# Patient Record
Sex: Male | Born: 1940 | Race: White | Hispanic: No | Marital: Married | Smoking: Never smoker
Health system: Southern US, Community
[De-identification: ages and names within clinical notes are randomized; demographics above are authoritative.]

## PROBLEM LIST (undated history)

## (undated) DIAGNOSIS — Z789 Other specified health status: Secondary | ICD-10-CM

## (undated) DIAGNOSIS — L649 Androgenic alopecia, unspecified: Secondary | ICD-10-CM

## (undated) HISTORY — PX: APPENDECTOMY: SHX54

---

## 2016-06-04 ENCOUNTER — Encounter (HOSPITAL_COMMUNITY): Payer: Self-pay | Admitting: Emergency Medicine

## 2016-06-04 ENCOUNTER — Inpatient Hospital Stay (HOSPITAL_COMMUNITY)
Admission: EM | Admit: 2016-06-04 | Discharge: 2016-06-06 | DRG: 069 | Disposition: A | Discharge status: Home / Self Care | Payer: Federal, State, Local not specified - PPO | Attending: Internal Medicine | Admitting: Internal Medicine

## 2016-06-04 ENCOUNTER — Emergency Department (HOSPITAL_COMMUNITY): Payer: Federal, State, Local not specified - PPO

## 2016-06-04 DIAGNOSIS — R29701 NIHSS score 1: Secondary | ICD-10-CM | POA: Diagnosis present

## 2016-06-04 DIAGNOSIS — N179 Acute kidney failure, unspecified: Secondary | ICD-10-CM | POA: Diagnosis not present

## 2016-06-04 DIAGNOSIS — M6282 Rhabdomyolysis: Secondary | ICD-10-CM | POA: Diagnosis present

## 2016-06-04 DIAGNOSIS — G459 Transient cerebral ischemic attack, unspecified: Principal | ICD-10-CM | POA: Insufficient documentation

## 2016-06-04 DIAGNOSIS — Z7982 Long term (current) use of aspirin: Secondary | ICD-10-CM

## 2016-06-04 DIAGNOSIS — I493 Ventricular premature depolarization: Secondary | ICD-10-CM | POA: Diagnosis present

## 2016-06-04 DIAGNOSIS — E871 Hypo-osmolality and hyponatremia: Secondary | ICD-10-CM | POA: Diagnosis not present

## 2016-06-04 DIAGNOSIS — R200 Anesthesia of skin: Secondary | ICD-10-CM

## 2016-06-04 DIAGNOSIS — I472 Ventricular tachycardia: Secondary | ICD-10-CM | POA: Diagnosis present

## 2016-06-04 DIAGNOSIS — I4729 Other ventricular tachycardia: Secondary | ICD-10-CM

## 2016-06-04 DIAGNOSIS — Z79899 Other long term (current) drug therapy: Secondary | ICD-10-CM

## 2016-06-04 DIAGNOSIS — R202 Paresthesia of skin: Secondary | ICD-10-CM | POA: Diagnosis not present

## 2016-06-04 DIAGNOSIS — Z823 Family history of stroke: Secondary | ICD-10-CM

## 2016-06-04 HISTORY — DX: Other specified health status: Z78.9

## 2016-06-04 HISTORY — DX: Androgenic alopecia, unspecified: L64.9

## 2016-06-04 LAB — COMPREHENSIVE METABOLIC PANEL
ALBUMIN: 4.9 g/dL (ref 3.5–5.0)
ALK PHOS: 75 U/L (ref 38–126)
ALT: 27 U/L (ref 17–63)
AST: 44 U/L — AB (ref 15–41)
Anion gap: 12 (ref 5–15)
BILIRUBIN TOTAL: 1.7 mg/dL — AB (ref 0.3–1.2)
BUN: 24 mg/dL — AB (ref 6–20)
CALCIUM: 9.8 mg/dL (ref 8.9–10.3)
CO2: 24 mmol/L (ref 22–32)
CREATININE: 1.53 mg/dL — AB (ref 0.61–1.24)
Chloride: 89 mmol/L — ABNORMAL LOW (ref 101–111)
GFR calc Af Amer: 51 mL/min — ABNORMAL LOW (ref >60)
GFR, EST NON AFRICAN AMERICAN: 44 mL/min — AB (ref >60)
GLUCOSE: 95 mg/dL (ref 65–99)
POTASSIUM: 3.7 mmol/L (ref 3.5–5.1)
Sodium: 125 mmol/L — ABNORMAL LOW (ref 135–145)
TOTAL PROTEIN: 7.7 g/dL (ref 6.5–8.1)

## 2016-06-04 LAB — CBC
HEMATOCRIT: 43.2 % (ref 39.0–52.0)
HEMOGLOBIN: 15.1 g/dL (ref 13.0–17.0)
MCH: 31.4 pg (ref 26.0–34.0)
MCHC: 35 g/dL (ref 30.0–36.0)
MCV: 89.8 fL (ref 78.0–100.0)
Platelets: 200 10*3/uL (ref 150–400)
RBC: 4.81 MIL/uL (ref 4.22–5.81)
RDW: 12.3 % (ref 11.5–15.5)
WBC: 9.8 10*3/uL (ref 4.0–10.5)

## 2016-06-04 LAB — DIFFERENTIAL
BASOS ABS: 0 10*3/uL (ref 0.0–0.1)
Basophils Relative: 0 %
Eosinophils Absolute: 0 10*3/uL (ref 0.0–0.7)
Eosinophils Relative: 0 %
LYMPHS ABS: 1.3 10*3/uL (ref 0.7–4.0)
LYMPHS PCT: 14 %
MONOS PCT: 5 %
Monocytes Absolute: 0.5 10*3/uL (ref 0.1–1.0)
NEUTROS ABS: 7.9 10*3/uL — AB (ref 1.7–7.7)
NEUTROS PCT: 81 %

## 2016-06-04 LAB — I-STAT CHEM 8, ED
BUN: 25 mg/dL — AB (ref 6–20)
CALCIUM ION: 1.1 mmol/L — AB (ref 1.12–1.23)
CHLORIDE: 94 mmol/L — AB (ref 101–111)
CREATININE: 1.6 mg/dL — AB (ref 0.61–1.24)
Glucose, Bld: 91 mg/dL (ref 65–99)
HCT: 46 % (ref 39.0–52.0)
Hemoglobin: 15.6 g/dL (ref 13.0–17.0)
Potassium: 3.7 mmol/L (ref 3.5–5.1)
SODIUM: 127 mmol/L — AB (ref 135–145)
TCO2: 26 mmol/L (ref 0–100)

## 2016-06-04 LAB — CK: Total CK: 1108 U/L — ABNORMAL HIGH (ref 49–397)

## 2016-06-04 LAB — I-STAT TROPONIN, ED: Troponin i, poc: 0.01 ng/mL (ref 0.00–0.08)

## 2016-06-04 LAB — APTT: APTT: 29 s (ref 24–37)

## 2016-06-04 LAB — PROTIME-INR
INR: 1.1 (ref 0.00–1.49)
Prothrombin Time: 14.4 seconds (ref 11.6–15.2)

## 2016-06-04 LAB — CBG MONITORING, ED: Glucose-Capillary: 91 mg/dL (ref 65–99)

## 2016-06-04 MED ORDER — SENNOSIDES-DOCUSATE SODIUM 8.6-50 MG PO TABS: 1.0000 | ORAL_TABLET | Freq: Every evening | ORAL | Status: DC | PRN

## 2016-06-04 MED ORDER — SODIUM CHLORIDE 0.9 % IV SOLN
1000.0000 mL | Freq: Once | INTRAVENOUS | Status: AC
  Administered 2016-06-04: 1000 mL via INTRAVENOUS

## 2016-06-04 MED ORDER — STROKE: EARLY STAGES OF RECOVERY BOOK
Freq: Once | Status: AC
  Administered 2016-06-05
  Filled 2016-06-04: qty 1

## 2016-06-04 MED ORDER — SODIUM CHLORIDE 0.9 % IV SOLN
INTRAVENOUS | Status: DC
  Administered 2016-06-04 – 2016-06-05 (×3): via INTRAVENOUS

## 2016-06-04 MED ORDER — ASPIRIN 325 MG PO TABS
325.0000 mg | ORAL_TABLET | Freq: Every day | ORAL | Status: DC
  Administered 2016-06-05: 325 mg via ORAL
  Filled 2016-06-04: qty 1

## 2016-06-04 MED ORDER — ASPIRIN 300 MG RE SUPP: 300.0000 mg | Freq: Every day | RECTAL | Status: DC

## 2016-06-04 MED ORDER — SODIUM CHLORIDE 0.9 % IV SOLN: Freq: Once | INTRAVENOUS | Status: DC

## 2016-06-04 MED ORDER — ENOXAPARIN SODIUM 30 MG/0.3ML ~~LOC~~ SOLN
30.0000 mg | SUBCUTANEOUS | Status: DC
  Administered 2016-06-05: 30 mg via SUBCUTANEOUS
  Filled 2016-06-04: qty 0.3

## 2016-06-04 NOTE — Consult Note (Signed)
Admission H&P    Chief Complaint: New onset left upper extremity numbness.  HPI: Ethan Padilla is an 75 y.o. male with no known medical illness and on no medications result in with acute onset of left hand and distal arm weakness at about 6:45 PM today. 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 There was no change in speech and no weakness nor numbness of left lower extremity or left face. CT scan of his head showed no acute intracranial abnormality. NIH stroke score was 1 for mild drift of left upper extremity. He was not considered a candidate for TPA because of minimal deficits present.  LSN: 6:45 PM on 06/04/2016 tPA Given: No: Minimal deficits mRankin:  Past medical history: No known medical illness  Past Surgical History  Procedure Laterality Date  . Appendectomy      Family history: Positive for stroke involving his father  Social History:  reports that he has never smoked. He does not have any smokeless tobacco history on file. He reports that he drinks alcohol. He reports that he does not use illicit drugs.  Allergies: No Known Allergies  Medications: None prior to admission  ROS: History obtained from the patient  General ROS: negative for - chills, fatigue, fever, night sweats, weight gain or weight loss Psychological ROS: negative for - behavioral disorder, hallucinations, memory difficulties, mood swings or suicidal ideation Ophthalmic ROS: negative for - blurry vision, double vision, eye pain or loss of vision ENT ROS: negative for - epistaxis, nasal discharge, oral lesions, sore throat, tinnitus or vertigo Allergy and Immunology ROS: negative for - hives or itchy/watery eyes Hematological and Lymphatic ROS: negative for - bleeding problems, bruising or swollen lymph nodes Endocrine ROS: negative for - galactorrhea, hair pattern changes, polydipsia/polyuria or temperature intolerance Respiratory ROS: negative for - cough,  hemoptysis, shortness of breath or wheezing Cardiovascular ROS: negative for - chest pain, dyspnea on exertion, edema or irregular heartbeat Gastrointestinal ROS: negative for - abdominal pain, diarrhea, hematemesis, nausea/vomiting or stool incontinence Genito-Urinary ROS: negative for - dysuria, hematuria, incontinence or urinary frequency/urgency Musculoskeletal ROS: negative for - joint swelling or muscular weakness Neurological ROS: as noted in HPI Dermatological ROS: negative for rash and skin lesion changes  Physical Examination: Blood pressure 117/84, pulse 88, temperature 97.3 F (36.3 C), temperature source Oral, resp. rate 15, height 5\' 7"  (1.702 m), weight 67.586 kg (149 lb), SpO2 98 %.  HEENT-  Normocephalic, no lesions, without obvious abnormality.  Normal external eye and conjunctiva.  Normal TM's bilaterally.  Normal auditory canals and external ears. Normal external nose, mucus membranes and septum.  Normal pharynx. Neck supple with no masses, nodes, nodules or enlargement. Cardiovascular - regular rate and rhythm, S1, S2 normal, no murmur, click, rub or gallop Lungs - chest clear, no wheezing, rales, normal symmetric air entry Abdomen - soft, non-tender; bowel sounds normal; no masses,  no organomegaly Extremities - no joint deformities, effusion, or inflammation and no edema  Neurologic Examination: Mental Status: Alert, oriented, thought content appropriate.  Speech fluent without evidence of aphasia. Able to follow commands without difficulty. Cranial Nerves: II-Visual fields were normal. III/IV/VI-Pupils were equal and reacted normally to light. Extraocular movements were full and conjugate.    V/VII-no facial numbness and no facial weakness. VIII-normal. X-normal speech except for mild abnormality in fluency (chronic and unchanged). XI: trapezius strength/neck flexion strength normal bilaterally XII-midline tongue extension with normal strength. Motor: Mild drift  of left  upper extremity as well as reduced grip strength of left hand compared to the right; motor exam is otherwise unremarkable. Sensory: Normal throughout. Deep Tendon Reflexes: Trace to 1+ and symmetric in upper extremities and absent in lower extremities. Plantars: Flexor bilaterally Cerebellar: Normal coordination of upper extremities. Carotid auscultation: Normal  Results for orders placed or performed during the hospital encounter of 06/04/16 (from the past 48 hour(s))  CBC     Status: None   Collection Time: 06/04/16  7:41 PM  Result Value Ref Range   WBC 9.8 4.0 - 10.5 K/uL   RBC 4.81 4.22 - 5.81 MIL/uL   Hemoglobin 15.1 13.0 - 17.0 g/dL   HCT 09.8 11.9 - 14.7 %   MCV 89.8 78.0 - 100.0 fL   MCH 31.4 26.0 - 34.0 pg   MCHC 35.0 30.0 - 36.0 g/dL   RDW 82.9 56.2 - 13.0 %   Platelets 200 150 - 400 K/uL  Differential     Status: Abnormal   Collection Time: 06/04/16  7:41 PM  Result Value Ref Range   Neutrophils Relative % 81 %   Neutro Abs 7.9 (H) 1.7 - 7.7 K/uL   Lymphocytes Relative 14 %   Lymphs Abs 1.3 0.7 - 4.0 K/uL   Monocytes Relative 5 %   Monocytes Absolute 0.5 0.1 - 1.0 K/uL   Eosinophils Relative 0 %   Eosinophils Absolute 0.0 0.0 - 0.7 K/uL   Basophils Relative 0 %   Basophils Absolute 0.0 0.0 - 0.1 K/uL  I-stat troponin, ED     Status: None   Collection Time: 06/04/16  7:50 PM  Result Value Ref Range   Troponin i, poc 0.01 0.00 - 0.08 ng/mL   Comment 3            Comment: Due to the release kinetics of cTnI, a negative result within the first hours of the onset of symptoms does not rule out myocardial infarction with certainty. If myocardial infarction is still suspected, repeat the test at appropriate intervals.   I-Stat Chem 8, ED     Status: Abnormal   Collection Time: 06/04/16  7:53 PM  Result Value Ref Range   Sodium 127 (L) 135 - 145 mmol/L   Potassium 3.7 3.5 - 5.1 mmol/L   Chloride 94 (L) 101 - 111 mmol/L   BUN 25 (H) 6 - 20 mg/dL    Creatinine, Ser 8.65 (H) 0.61 - 1.24 mg/dL   Glucose, Bld 91 65 - 99 mg/dL   Calcium, Ion 7.84 (L) 1.12 - 1.23 mmol/L   TCO2 26 0 - 100 mmol/L   Hemoglobin 15.6 13.0 - 17.0 g/dL   HCT 69.6 29.5 - 28.4 %  CBG monitoring, ED     Status: None   Collection Time: 06/04/16  8:08 PM  Result Value Ref Range   Glucose-Capillary 91 65 - 99 mg/dL   Ct Head Code Stroke W/o Cm  06/04/2016  CLINICAL DATA:  Code stroke. Acute onset of left hand numbness earlier today. EXAM: CT HEAD WITHOUT CONTRAST TECHNIQUE: Contiguous axial images were obtained from the base of the skull through the vertex without intravenous contrast. COMPARISON:  None. FINDINGS: Ventricular system normal in size and appearance for age. Mild cortical and deep atrophy. Mild changes of small vessel disease of the white matter diffusely. No mass lesion. No midline shift. No acute hemorrhage or hematoma. No extra-axial fluid collections. No evidence of acute cortical stroke. No skull fractures identified. No intrinsic osseous abnormality. Visualized paranasal  sinuses well aerated. Nasal bones intact. Normal-appearing sella turcica. CT visualized paranasal sinuses well aerated. Opacification of right mastoid air cells. Left mastoid air cells and bilateral middle ear cavities well-aerated. IMPRESSION: 1. No acute intracranial abnormality. 2. 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 3. Right mastoid effusion. These results were called by telephone at the time of interpretation on 06/04/2016 at 7:52 pm to Dr. Roseanne RenoStewart of Neurology, who verbally acknowledged these results. Electronically Signed   By: Hulan Saashomas  Lawrence M.D.   On: 06/04/2016 19:53    Assessment: 75 y.o. male presenting with probable right subcortical TIA or possible small vessel infarction.  Stroke Risk Factors - family history  Plan: 1. HgbA1c, fasting lipid panel 2. MRI, MRA  of the brain without contrast 3. PT consult, OT  consult 4. Echocardiogram 5. Carotid dopplers 6. Prophylactic therapy-Antiplatelet med: Aspirin  7. Risk factor modification 8. Telemetry monitoring  C.R. Roseanne RenoStewart, MD [facility] [phone]  06/04/2016, 8:26 PM

## 2016-06-04 NOTE — ED Notes (Signed)
Pt here with left hand numbness starting at approx 1845. Pt also reports nausea. MD Delo at bedside. Code Stroke activated.

## 2016-06-04 NOTE — ED Notes (Signed)
Pt at CT

## 2016-06-04 NOTE — ED Notes (Signed)
Pt complains of leg cramps.

## 2016-06-04 NOTE — ED Notes (Signed)
Stroke team at bedside

## 2016-06-04 NOTE — ED Notes (Signed)
MD at bedside. 

## 2016-06-04 NOTE — H&P (Signed)
History and Physical    Ethan Padilla JYN:[id number] DOB: 1941-05-25 DOA: 06/04/2016  PCP:  Jasper Loser - [place] Consultants:  None Patient coming from:  Daughter's home (visiting)  Chief Complaint: L arm numbness  HPI: Ethan Padilla is a 75 y.o. male with no significant past medical history presenting with L arm/hand numbness/weakness.  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  Went inside regularly and drank lots of cold ice water - but nothing else.  Came inside about 1800 to eat with daughter.  Started feeling very nauseated, had been sweating all day and so decided to take a cold shower.  Some cramping in legs which is unusual for him.  Went to lie down and elevated legs.  Some numbness in left hand, on and off.  Chewed a couple of aspirin.  +weakness left arm and hand.  Also some cramping in left hand.  No trouble swallowing.  No dysarthria or slurring, no word finding difficulties.  Has lifelong dysfluency. No headache or visual disturbance.  Momentary chest pain which was very fleeting.  Does have chest pain intermittently which improves with ingesting fluids.  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   ED Course: Code Stroke called; head CT negative, labs unremarkable  Review of Systems: As per HPI; otherwise 10 point review of systems reviewed and negative.   Ambulatory Status: ambulatory  Past Medical History  Diagnosis Date  . Male pattern baldness     Past Surgical History  Procedure Laterality Date  . Appendectomy      Social History   Social History  . Marital Status: Married    Spouse Name: N/A  . Number of Children: N/A  . Years of Education: N/A   Occupational History  . Transport planner for [employer]    Social History Main Topics  . Smoking status: Never Smoker   . Smokeless tobacco: Not on file  . Alcohol Use: Yes     Comment: occasional  . Drug Use: No   . Sexual Activity: Not on file   Other Topics Concern  . Not on file   Social History Narrative   Lives in [place], [place]    No Known Allergies  History reviewed. No pertinent family history.  Prior to Admission medications   Medication Sig Start Date End Date Taking? Authorizing Provider  aspirin 325 MG tablet Take 650 mg by mouth once.   Yes Historical Provider, MD  finasteride (PROPECIA) 1 MG tablet Take 1 mg by mouth daily. 03/20/16  Yes Historical Provider, MD    Physical Exam: Filed Vitals:   06/04/16 2130 06/04/16 2145 06/04/16 2200 06/04/16 2215  BP: 108/85 103/87 113/84 103/73  Pulse: 85 89 80 90  Temp:      TempSrc:      Resp: 21 17 18 18   Height:      Weight:      SpO2: 97% 97% 96% 96%     General:  Appears calm and comfortable and is NAD; occasional speech stuttering which is apparently baseline for him Eyes:  PERRL, EOMI, normal lids, iris ENT:  grossly normal hearing, lips & tongue, mmm Neck:  no LAD, masses or thyromegaly Cardiovascular:  RRR, no m/r/g. No LE edema.  Respiratory:  CTA bilaterally, no w/r/r. Normal respiratory effort. Abdomen:  soft, ntnd, NABS Skin:  no rash or induration seen on limited exam Musculoskeletal:  grossly normal  tone BUE/BLE, good ROM, no bony abnormality; possibly slightly decrease in strength of R upper arm but no other deficits appreciated; +cramp in L calf while trying to test reflexes Psychiatric:  grossly normal mood and affect, speech fluent and appropriate, AOx3 Neurologic:  CN 2-12 grossly intact, moves all extremities in coordinated fashion, sensation intact  Labs on Admission: I have personally reviewed following labs and imaging studies  CBC:  Recent Labs Lab 06/04/16 1941 06/04/16 1953  WBC 9.8  --   NEUTROABS 7.9*  --   HGB 15.1 15.6  HCT 43.2 46.0  MCV 89.8  --   PLT 200  --    Basic Metabolic Panel:  Recent Labs Lab 06/04/16 1941 06/04/16 1953  NA 125* 127*  K 3.7 3.7  CL 89* 94*  CO2 24   --   GLUCOSE 95 91  BUN 24* 25*  CREATININE 1.53* 1.60*  CALCIUM 9.8  --    GFR: Estimated Creatinine Clearance: 39 mL/min (by C-G formula based on Cr of 1.6). Liver Function Tests:  Recent Labs Lab 06/04/16 1941  AST 44*  ALT 27  ALKPHOS 75  BILITOT 1.7*  PROT 7.7  ALBUMIN 4.9   No results for input(s): LIPASE, AMYLASE in the last 168 hours. No results for input(s): AMMONIA in the last 168 hours. Coagulation Profile:  Recent Labs Lab 06/04/16 1941  INR 1.10   Cardiac Enzymes:  Recent Labs Lab 06/04/16 1941  CKTOTAL 1108*   BNP (last 3 results) No results for input(s): PROBNP in the last 8760 hours. HbA1C: No results for input(s): HGBA1C in the last 72 hours. CBG:  Recent Labs Lab 06/04/16 2008  GLUCAP 91   Lipid Profile: No results for input(s): CHOL, HDL, LDLCALC, TRIG, CHOLHDL, LDLDIRECT in the last 72 hours. Thyroid Function Tests: No results for input(s): TSH, T4TOTAL, FREET4, T3FREE, THYROIDAB in the last 72 hours. Anemia Panel: No results for input(s): VITAMINB12, FOLATE, FERRITIN, TIBC, IRON, RETICCTPCT in the last 72 hours. Urine analysis: No results found for: COLORURINE, APPEARANCEUR, LABSPEC, PHURINE, GLUCOSEU, HGBUR, BILIRUBINUR, KETONESUR, PROTEINUR, UROBILINOGEN, NITRITE, LEUKOCYTESUR  Creatinine Clearance: Estimated Creatinine Clearance: 39 mL/min (by C-G formula based on Cr of 1.6).  Sepsis Labs: (procalcitonin:4,lacticidven:4) )No results found for this or any previous visit (from the past 240 hour(s)).   Radiological Exams on Admission: Ct Head Code Stroke W/o Cm  06/04/2016  CLINICAL DATA:  Code stroke. Acute onset of left hand numbness earlier today. EXAM: CT HEAD WITHOUT CONTRAST TECHNIQUE: Contiguous axial images were obtained from the base of the skull through the vertex without intravenous contrast. COMPARISON:  None. FINDINGS: Ventricular system normal in size and appearance for age. Mild cortical and deep atrophy.  Mild changes of small vessel disease of the white matter diffusely. No mass lesion. No midline shift. No acute hemorrhage or hematoma. No extra-axial fluid collections. No evidence of acute cortical stroke. No skull fractures identified. No intrinsic osseous abnormality. Visualized paranasal sinuses well aerated. Nasal bones intact. Normal-appearing sella turcica. CT visualized paranasal sinuses well aerated. Opacification of right mastoid air cells. Left mastoid air cells and bilateral middle ear cavities well-aerated. IMPRESSION: 1. No acute intracranial abnormality. 2. 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 3. Right mastoid effusion. These results were called by telephone at the time of interpretation on 06/04/2016 at 7:52 pm to Dr. Roseanne Reno of Neurology, who verbally acknowledged these results. Electronically Signed   By: Hulan Saas M.D.   On: 06/04/2016 19:53  EKG: Independently reviewed.  NSR with rate 85; PVCs present with no evidence of acute ischemia  Assessment/Plan Active Problems:   TIA (transient ischemic attack)   Hyponatremia   AKI (acute kidney injury) (HCC)   PVC (premature ventricular contraction)   TIA -Patient seen by neurology with concern for TIA -Will admit to obs status with telemetry monitoring and order MRI/MRA -Due to low suspicion for CVA/TIA, was uncertain whether to complete evaluation unless MRI abnormal.  However, he is also having a number of PVCs and has never had an Echo.  Will order Echo and also carotid dopplers to complete evaluation. -Risk stratification with FLP, A1c. -Continue ASA 325 mg daily   AKI/Hyponatremia -Suspect that the underlying cause of all of patient's symptoms is actually exertional rhabdomyolysis - he worked outside all day in the blistering heat and humidity while only rehydrating with water.  Suspect dilutional hyponatremia.   -CK is elevated to 1108, which is fairly mild for  exertional rhabdo. -Will give 2L bolus in ER and continue IVF at 150 cc/hour while inpatient. -Repeat labs in AM and anticipate improvement.   DVT prophylaxis: Lovenox  Code Status: Full - confirmed with patient/family Family Communication: Daughter present at bedside throughout and in agreement with the plan.  She is NOK: Ethan ChestnutSarah Padilla, [phone]. Disposition Plan: Home once clinically improved Consults called: Neurology (by ER) Admission status: Observation   Jonah BlueJennifer Alese Furniss MD [facility]  If 7PM-7AM, please contact night-coverage [email or web address] Password [email or web address]  06/04/2016, 10:44 PM

## 2016-06-04 NOTE — ED Provider Notes (Signed)
CSN: [id number]     Arrival date & time 06/04/16  1921 History   First MD Initiated Contact with Patient 06/04/16 1939     Chief Complaint  Patient presents with  . Numbness  . Nausea     (Consider location/radiation/quality/duration/timing/severity/associated sxs/prior Treatment) HPI Comments: Patient is a 75 year old male with no significant past medical history. He presents for evaluation of numbness in his left hand. This started approximately 45 minutes prior to arrival. He reports that he was working outside in the heat all day in the garden and his symptoms began shortly after he came inside for dinner. He denies any weakness. He denies any involvement of his face or leg. He reports nausea but denies headache. There are no aggravating or alleviating factors. He does report taking 2 baby aspirin prior to coming in.  The history is provided by the patient.    No past medical history on file. No past surgical history on file. No family history on file. Social History  Substance Use Topics  . Smoking status: Not on file  . Smokeless tobacco: Not on file  . Alcohol Use: Not on file    Review of Systems  All other systems reviewed and are negative.     Allergies  Review of patient's allergies indicates not on file.  Home Medications   Prior to Admission medications   Not on File   BP 135/92 mmHg  Pulse 87  Temp(Src) 97.3 F (36.3 C) (Oral)  Resp 17  Ht  (1.702 m)  Wt 149 lb (67.586 kg)  BMI 23.33 kg/m2  SpO2 98% Physical Exam  Constitutional: He is oriented to person, place, and time. He appears well-developed and well-nourished. No distress.  HENT:  Head: Normocephalic and atraumatic.  Mouth/Throat: Oropharynx is clear and moist.  Eyes: EOM are normal. Pupils are equal, round, and reactive to light.  Neck: Normal range of motion. Neck supple.  Cardiovascular: Normal rate and regular rhythm.  Exam reveals no friction rub.   No murmur  heard. Pulmonary/Chest: Effort normal and breath sounds normal. No respiratory distress. He has no wheezes. He has no rales.  Abdominal: Soft. Bowel sounds are normal. He exhibits no distension. There is no tenderness.  Musculoskeletal: Normal range of motion. He exhibits no edema.  Neurological: He is alert and oriented to person, place, and time. No cranial nerve deficit. He exhibits normal muscle tone. Coordination normal.  Skin: Skin is warm and dry. He is not diaphoretic.  Nursing note and vitals reviewed.   ED Course  Procedures (including critical care time) Labs Review Labs Reviewed  PROTIME-INR  APTT  CBC  DIFFERENTIAL  COMPREHENSIVE METABOLIC PANEL  I-STAT TROPOININ, ED  I-STAT CHEM 8, ED  CBG MONITORING, ED    Imaging Review No results found. I have personally reviewed and evaluated these images and lab results as part of my medical decision-making.   EKG Interpretation   Date/Time:  Thursday June 04 2016 19:51:58 EDT Ventricular Rate:  85 PR Interval:    QRS Duration: 100 QT Interval:  388 QTC Calculation: 462 R Axis:   -59 Text Interpretation:  Sinus rhythm Borderline short PR interval  Inferolateral infarct, old Confirmed by Daisja Kessinger  MD, Mildreth Reek ([id number]) on  06/04/2016 10:10:56 PM      MDM   Final diagnoses:  None    Patient presents with complaints of left arm numbness that started after working in the yard. He is neurologically intact and his complaints are mainly subjective. However,  since he was in the timeframe of possible intervention, a code stroke was called. His initial head CT was negative and laboratory studies are unremarkable. He was evaluated by Dr. Roseanne RenoStewart who is recommending admission. He will be admitted to the hospitalist service under the care of Dr. Ophelia CharterYates.    Geoffery Lyonsouglas Lyndsey Demos, MD 06/04/16 2212

## 2016-06-04 NOTE — Progress Notes (Signed)
Pt admitted from ED with stroke symptoms, pt alert and oriented, settled in bed with call light at bedside, pt and family reassured,will continue to monitor. Obasogie-Asidi, Akshitha Culmer Efe

## 2016-06-05 ENCOUNTER — Encounter (HOSPITAL_COMMUNITY): Payer: Self-pay | Admitting: Student

## 2016-06-05 ENCOUNTER — Observation Stay (HOSPITAL_COMMUNITY): Payer: Federal, State, Local not specified - PPO

## 2016-06-05 ENCOUNTER — Observation Stay (HOSPITAL_BASED_OUTPATIENT_CLINIC_OR_DEPARTMENT_OTHER): Payer: Federal, State, Local not specified - PPO

## 2016-06-05 DIAGNOSIS — R29701 NIHSS score 1: Secondary | ICD-10-CM | POA: Diagnosis present

## 2016-06-05 DIAGNOSIS — M6282 Rhabdomyolysis: Secondary | ICD-10-CM | POA: Diagnosis present

## 2016-06-05 DIAGNOSIS — E871 Hypo-osmolality and hyponatremia: Secondary | ICD-10-CM | POA: Diagnosis present

## 2016-06-05 DIAGNOSIS — I493 Ventricular premature depolarization: Secondary | ICD-10-CM

## 2016-06-05 DIAGNOSIS — G458 Other transient cerebral ischemic attacks and related syndromes: Secondary | ICD-10-CM | POA: Diagnosis not present

## 2016-06-05 DIAGNOSIS — E860 Dehydration: Secondary | ICD-10-CM | POA: Diagnosis not present

## 2016-06-05 DIAGNOSIS — Z823 Family history of stroke: Secondary | ICD-10-CM | POA: Diagnosis not present

## 2016-06-05 DIAGNOSIS — R202 Paresthesia of skin: Secondary | ICD-10-CM | POA: Diagnosis present

## 2016-06-05 DIAGNOSIS — G459 Transient cerebral ischemic attack, unspecified: Secondary | ICD-10-CM

## 2016-06-05 DIAGNOSIS — N179 Acute kidney failure, unspecified: Secondary | ICD-10-CM | POA: Diagnosis present

## 2016-06-05 DIAGNOSIS — I472 Ventricular tachycardia: Secondary | ICD-10-CM

## 2016-06-05 DIAGNOSIS — Z7982 Long term (current) use of aspirin: Secondary | ICD-10-CM | POA: Diagnosis not present

## 2016-06-05 DIAGNOSIS — I4729 Other ventricular tachycardia: Secondary | ICD-10-CM

## 2016-06-05 DIAGNOSIS — Z79899 Other long term (current) drug therapy: Secondary | ICD-10-CM | POA: Diagnosis not present

## 2016-06-05 DIAGNOSIS — R208 Other disturbances of skin sensation: Secondary | ICD-10-CM | POA: Diagnosis not present

## 2016-06-05 LAB — VAS US CAROTID
LCCADSYS: -84 cm/s
LEFT ECA DIAS: -29 cm/s
LEFT VERTEBRAL DIAS: 17 cm/s
Left CCA dist dias: -22 cm/s
Left CCA prox dias: -19 cm/s
Left CCA prox sys: -96 cm/s
Left ICA dist dias: -33 cm/s
Left ICA dist sys: -90 cm/s
Left ICA prox dias: -30 cm/s
Left ICA prox sys: -84 cm/s
RCCAPDIAS: -21 cm/s
RIGHT ECA DIAS: -15 cm/s
RIGHT VERTEBRAL DIAS: 17 cm/s
Right CCA prox sys: -83 cm/s
Right cca dist sys: -104 cm/s

## 2016-06-05 LAB — BASIC METABOLIC PANEL
ANION GAP: 10 (ref 5–15)
Anion gap: 3 — ABNORMAL LOW (ref 5–15)
BUN: 21 mg/dL — ABNORMAL HIGH (ref 6–20)
BUN: 9 mg/dL (ref 6–20)
CHLORIDE: 109 mmol/L (ref 101–111)
CO2: 25 mmol/L (ref 22–32)
CO2: 24 mmol/L (ref 22–32)
CREATININE: 0.77 mg/dL (ref 0.61–1.24)
Calcium: 9.2 mg/dL (ref 8.9–10.3)
Calcium: 8.3 mg/dL — ABNORMAL LOW (ref 8.9–10.3)
Chloride: 96 mmol/L — ABNORMAL LOW (ref 101–111)
Creatinine, Ser: 1.25 mg/dL — ABNORMAL HIGH (ref 0.61–1.24)
GFR calc Af Amer: >60 mL/min (ref >60)
GFR calc non Af Amer: 56 mL/min — ABNORMAL LOW (ref >60)
GFR calc non Af Amer: >60 mL/min (ref >60)
GLUCOSE: 79 mg/dL (ref 65–99)
Glucose, Bld: 98 mg/dL (ref 65–99)
POTASSIUM: 3.6 mmol/L (ref 3.5–5.1)
POTASSIUM: 4.4 mmol/L (ref 3.5–5.1)
Sodium: 130 mmol/L — ABNORMAL LOW (ref 135–145)
Sodium: 137 mmol/L (ref 135–145)

## 2016-06-05 LAB — HEPATIC FUNCTION PANEL
ALBUMIN: 4.2 g/dL (ref 3.5–5.0)
ALK PHOS: 75 U/L (ref 38–126)
ALT: 23 U/L (ref 17–63)
AST: 42 U/L — AB (ref 15–41)
BILIRUBIN TOTAL: 1.1 mg/dL (ref 0.3–1.2)
Bilirubin, Direct: 0.2 mg/dL (ref 0.1–0.5)
Indirect Bilirubin: 0.9 mg/dL (ref 0.3–0.9)
TOTAL PROTEIN: 6.6 g/dL (ref 6.5–8.1)

## 2016-06-05 LAB — CK
CK TOTAL: 766 U/L — AB (ref 49–397)
Total CK: 1136 U/L — ABNORMAL HIGH (ref 49–397)

## 2016-06-05 LAB — ECHOCARDIOGRAM COMPLETE
Height: 67 in
Weight: 2384 oz

## 2016-06-05 LAB — CBC
HEMATOCRIT: 39.9 % (ref 39.0–52.0)
HEMOGLOBIN: 13.8 g/dL (ref 13.0–17.0)
MCH: 31.2 pg (ref 26.0–34.0)
MCHC: 34.6 g/dL (ref 30.0–36.0)
MCV: 90.1 fL (ref 78.0–100.0)
Platelets: 166 10*3/uL (ref 150–400)
RBC: 4.43 MIL/uL (ref 4.22–5.81)
RDW: 12.3 % (ref 11.5–15.5)
WBC: 7.2 10*3/uL (ref 4.0–10.5)

## 2016-06-05 LAB — MAGNESIUM: Magnesium: 2 mg/dL (ref 1.7–2.4)

## 2016-06-05 LAB — LIPID PANEL
Cholesterol: 163 mg/dL (ref 0–200)
HDL: 67 mg/dL (ref >40)
LDL Cholesterol: 82 mg/dL (ref 0–99)
TRIGLYCERIDES: 70 mg/dL (ref <150)
Total CHOL/HDL Ratio: 2.4 RATIO
VLDL: 14 mg/dL (ref 0–40)

## 2016-06-05 MED ORDER — SODIUM CHLORIDE 0.9 % IV SOLN
INTRAVENOUS | Status: DC
  Administered 2016-06-05: 18:00:00 via INTRAVENOUS

## 2016-06-05 MED ORDER — ENOXAPARIN SODIUM 40 MG/0.4ML ~~LOC~~ SOLN
40.0000 mg | SUBCUTANEOUS | Status: DC
  Administered 2016-06-06: 40 mg via SUBCUTANEOUS
  Filled 2016-06-05: qty 0.4

## 2016-06-05 MED ORDER — ASPIRIN EC 81 MG PO TBEC
81.0000 mg | DELAYED_RELEASE_TABLET | Freq: Every day | ORAL | Status: DC
  Administered 2016-06-06: 81 mg via ORAL
  Filled 2016-06-05: qty 1

## 2016-06-05 NOTE — Evaluation (Signed)
Occupational Therapy Evaluation Patient Details Name: Ethan Padilla MRN: [id number] DOB: 07/05/1941 Today's Date: 06/05/2016    History of Present Illness 75 yo male admitted with L arm / hand numbness MRI (-) CT (-) continued workup for TIA PMH: appendectomy   Clinical Impression   Patient evaluated by Occupational Therapy with no further acute OT needs identified. All education has been completed and the patient has no further questions. See below for any follow-up Occupational Therapy or equipment needs. OT to sign off. Thank you for referral.      Follow Up Recommendations  No OT follow up    Equipment Recommendations  None recommended by OT    Recommendations for Other Services       Precautions / Restrictions Precautions Precautions: None      Mobility Bed Mobility Overal bed mobility: Independent                Transfers Overall transfer level: Independent                    Balance                                            ADL Overall ADL's : Independent                                       General ADL Comments: completed grooming and toilet transfer. Pt is at baseline. pt expressed "i am ready to go home" pt educated that PT Rocky LinkKen will assess stairs prior to d/c      Vision     Perception     Praxis      Pertinent Vitals/Pain Pain Assessment: No/denies pain     Hand Dominance Right   Extremity/Trunk Assessment Upper Extremity Assessment Upper Extremity Assessment: Overall WFL for tasks assessed   Lower Extremity Assessment Lower Extremity Assessment: Overall WFL for tasks assessed   Cervical / Trunk Assessment Cervical / Trunk Assessment: Normal   Communication Communication Communication: Other (comment) (baseline speech deficits)   Cognition Arousal/Alertness: Awake/alert Behavior During Therapy: WFL for tasks assessed/performed Overall Cognitive Status: Within Functional Limits for  tasks assessed                     General Comments       Exercises       Shoulder Instructions      Home Living Family/patient expects to be discharged to:: Private residence Living Arrangements: Spouse/significant other;Children Available Help at Discharge: Family Type of Home: Apartment Home Access: Elevator;Stairs to enter Secretary/administratorntrance Stairs-Number of Steps: 2   Home Layout: Two level     Bathroom Shower/Tub: Chief Strategy OfficerTub/shower unit   Bathroom Toilet: Standard     Home Equipment: None   Additional Comments: Pt has 3 homes that he stays at and when asked which he will be at most he states "all of them equal" Pt will d/c to daughters home with stairs to enter and stairs for 2 floor. monday will fly to d/c and require 2 stairs to enter high rise condo with elevator and then will leave this location to go to family home that is old with multiple stairs to enter and to reach bedrooms      Prior Functioning/Environment Level of Independence: Independent  OT Diagnosis:     OT Problem List:     OT Treatment/Interventions:      OT Goals(Current goals can be found in the care plan section)    OT Frequency:     Barriers to D/C:            Co-evaluation              End of Session    Activity Tolerance: Patient tolerated treatment well Patient left: in chair;with call bell/phone within reach;with chair alarm set   Time: 8119-1478 OT Time Calculation (min): 18 min Charges:  OT General Charges $OT Visit: 1 Procedure OT Evaluation $OT Eval Low Complexity: 1 Procedure G-Codes: OT G-codes **NOT FOR INPATIENT CLASS** Functional Assessment Tool Used: clinical judgement Functional Limitation: Self care Self Care Current Status (G9562): 0 percent impaired, limited or restricted Self Care Goal Status (Z3086): 0 percent impaired, limited or restricted Self Care Discharge Status (V7846): 0 percent impaired, limited or restricted  Boone Master  B 06/05/2016, 10:23 AM   Mateo Flow   OTR/L Pager: [phone] Office: [phone] .

## 2016-06-05 NOTE — Progress Notes (Signed)
PROGRESS NOTE  Ethan Padilla  ZOX:[id number] DOB: 1941/01/23  DOA: 06/04/2016 PCP: No primary care provider on file.   Brief Narrative:  75 year old male, works as a Transport planner for [employer], travels a lot between [place] and [place], physically quite active, no significant PMH, was visiting his daughter who lives and works in [place], presented to [facility] ED on 06/04/16 with left arm/hand numbness and weakness. He had worked in his daughter's yard all day in the heat and apparently did not hydrate himself adequately. He complained of some cramps in his legs. He took some aspirin and decided to come to the ED. History of chest pain intermittently which improves with ingesting fluids. 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 In the ED, code stroke was called. Head CT was negative and labs were unremarkable. Patient was admitted for further evaluation and management.   Assessment & Plan:   Active Problems:   TIA (transient ischemic attack)   Hyponatremia   AKI (acute kidney injury) (HCC)   PVC (premature ventricular contraction)   Possible TIA versus dehydration - Resultant intermittent tingling of left arm. Deficits have resolved. - CT head 06/04/16: No acute intracranial abnormality. - MRI brain 06/05/16: No acute stroke - MRA brain 06/05/16: Unremarkable. - Carotid Dopplers: No significant ICA stenosis. - 2-D echo: LVEF 50-55 percent and grade 1 diastolic dysfunction, moderate MR. - LDL: 82 - Hemoglobin A1c: Pending. - No antithrombotic prior to admission, now on aspirin 325 MG daily. - No therapy needs identified. - Neurology consultation on admission and stroke in follow-up today appreciated. Discussed with stroke service: Have requested MRI of C-spine to rule out myelopathy as cause of his left upper extremity symptoms and EEG to rule out seizures.  Mild rhabdomyolysis - Likely secondary to physical exertion and the heart weather. -  Hydrating with IV fluids. Still complains of some soreness in his leg muscles. Advised avoiding strenuous activity for the next week or 2. Follow CK in a.m. - CK on admission 1108. Has not changed significantly  Hyponatremia - Presented with sodium of 125 - May be related to free water intake versus dehydration. Continue normal saline hydration. Has improved compared to admission, 130 at midnight. Follow BMP now and in a.m.  Acute kidney injury - Likely secondary to dehydration and mild rhabdomyolysis. Presented with creatinine of 1.53 which improved to 1.25 after hydration in ED. Baseline creatinine unknown. IV fluids and follow BMP now & in a.m.  Frequent PVCs/nonsustained wide complex tachycardia - Telemetry showed 15 beat nonsustained wide complex tachycardia on 7/14 at 12:58 PM. - Cardiology consulted. Repeat BMP and magnesium now.  Snoring - May pursue evaluation for sleep apnea as outpatient.   DVT prophylaxis: Lovenox Code Status: Full Family Communication: Discussed in great detail with patient. No family at bedside. Disposition Plan: DC home when clinically improved.   Consultants:   Neurology  Cardiology  Procedures:   None  Antimicrobials:   None    Subjective: Overall feels better than last night. No symptoms related to left upper extremity. Occasional cramping and soreness in her leg muscles.  Objective:  Filed Vitals:   06/05/16 0430 06/05/16 0632 06/05/16 0830 06/05/16 1032  BP: 95/55 100/58 90/62 95/64   Pulse: 72 75 69 87  Temp:  98 F (36.7 C) 98 F (36.7 C) 97.7 F (36.5 C)  TempSrc:   Oral Oral  Resp: 18 18 16 17   Height:      Weight:      SpO2:  98% 98% 98% 97%    Intake/Output Summary (Last 24 hours) at 06/05/16 1627 Last data filed at 06/05/16 1431  Gross per 24 hour  Intake    720 ml  Output      0 ml  Net    720 ml   Filed Weights   06/04/16 1928  Weight: 67.586 kg (149 lb)    Examination:  General exam: Pleasant elderly  male lying comfortably supine in bed. Respiratory system: Clear to auscultation. Respiratory effort normal. Cardiovascular system: S1 & S2 heard, RRR. No JVD, murmurs, rubs, gallops or clicks. No pedal edema. Telemetry: Sinus rhythm mostly. PVCs. 15 beat nonsustained wide complex tachycardia noted on 7/14 at 12:58 PM? NSVT. Gastrointestinal system: Abdomen is nondistended, soft and nontender. No organomegaly or masses felt. Normal bowel sounds heard. Central nervous system: Alert and oriented. No focal neurological deficits. Extremities: Symmetric 5 x 5 power. Skin: No rashes, lesions or ulcers Psychiatry: Judgement and insight appear normal. Mood & affect appropriate.     Data Reviewed: I have personally reviewed following labs and imaging studies  CBC:  Recent Labs Lab 06/04/16 1941 06/04/16 1953 06/05/16 0010  WBC 9.8  --  7.2  NEUTROABS 7.9*  --   --   HGB 15.1 15.6 13.8  HCT 43.2 46.0 39.9  MCV 89.8  --  90.1  PLT 200  --  166   Basic Metabolic Panel:  Recent Labs Lab 06/04/16 1941 06/04/16 1953 06/05/16 0010  NA 125* 127* 130*  K 3.7 3.7 4.4  CL 89* 94* 96*  CO2 24  --  24  GLUCOSE 95 91 79  BUN 24* 25* 21*  CREATININE 1.53* 1.60* 1.25*  CALCIUM 9.8  --  9.2   GFR: Estimated Creatinine Clearance: 49.9 mL/min (by C-G formula based on Cr of 1.25). Liver Function Tests:  Recent Labs Lab 06/04/16 1941 06/05/16 0755  AST 44* 42*  ALT 27 23  ALKPHOS 75 75  BILITOT 1.7* 1.1  PROT 7.7 6.6  ALBUMIN 4.9 4.2   No results for input(s): LIPASE, AMYLASE in the last 168 hours. No results for input(s): AMMONIA in the last 168 hours. Coagulation Profile:  Recent Labs Lab 06/04/16 1941  INR 1.10   Cardiac Enzymes:  Recent Labs Lab 06/04/16 1941 06/05/16 0755  CKTOTAL 1108* 1136*   BNP (last 3 results) No results for input(s): PROBNP in the last 8760 hours. HbA1C: No results for input(s): HGBA1C in the last 72 hours. CBG:  Recent Labs Lab  06/04/16 2008  GLUCAP 91   Lipid Profile:  Recent Labs  06/05/16 0010  CHOL 163  HDL 67  LDLCALC 82  TRIG 70  CHOLHDL 2.4   Thyroid Function Tests: No results for input(s): TSH, T4TOTAL, FREET4, T3FREE, THYROIDAB in the last 72 hours. Anemia Panel: No results for input(s): VITAMINB12, FOLATE, FERRITIN, TIBC, IRON, RETICCTPCT in the last 72 hours.  Sepsis Labs: No results for input(s): PROCALCITON, LATICACIDVEN in the last 168 hours.  No results found for this or any previous visit (from the past 240 hour(s)).       Radiology Studies: Dg Chest 2 View  06/05/2016  CLINICAL DATA:  75 year old male with left side weakness, TIA. Initial encounter. EXAM: CHEST  2 VIEW COMPARISON:  Brain MRI from today. FINDINGS: Mildly tortuous thoracic aorta. Other mediastinal contours are within normal limits. Normal lung volumes. No pneumothorax, pulmonary edema, pleural effusion or confluent pulmonary opacity. Mildly increased interstitial markings, nonspecific and likely chronic. No acute osseous  abnormality identified. Negative visible bowel gas pattern. IMPRESSION: No acute cardiopulmonary abnormality. Electronically Signed   By: Odessa FlemingH  Hall M.D.   On: 06/05/2016 07:16   Mr Brain Wo Contrast  06/05/2016  CLINICAL DATA:  Initial evaluation for left-sided numbness, weakness. EXAM: MRI HEAD WITHOUT CONTRAST MRA HEAD WITHOUT CONTRAST TECHNIQUE: Multiplanar, multiecho pulse sequences of the brain and surrounding structures were obtained without intravenous contrast. Angiographic images of the head were obtained using MRA technique without contrast. COMPARISON:  Prior CT from 06/04/2016. FINDINGS: MRI HEAD FINDINGS Cerebral volume within normal limits for patient age. No significant cerebral white matter disease. No abnormal foci of restricted diffusion to suggest acute ischemia. Gray-white matter differentiation maintained. Major intracranial vascular flow voids are preserved. No areas of chronic infarction  identified. No mass lesion, midline shift, or mass effect. No hydrocephalus. No extra-axial fluid collection. Major dural sinuses are grossly patent. Incidental note made of a large DVA within the right cerebellar hemisphere. Craniocervical junction normal. Visualized upper cervical spine within normal limits without significant degenerative changes. Pituitary gland normal. No acute abnormality about the globes and orbits. Mild scattered mucosal thickening within ethmoidal air cells. Paranasal sinuses are otherwise clear. Right mastoid effusion present. Left mastoid air cells clear. Inner ear structures grossly normal. Bone marrow signal intensity within normal limits. No scalp soft tissue abnormality. MRA HEAD FINDINGS ANTERIOR CIRCULATION: Visualized distal cervical segments of the internal carotid arteries are patent with antegrade flow. Petrous, cavernous, and supraclinoid segments widely patent. A1 segments, anterior communicating artery common anterior cerebral arteries well opacified. M1 segments patent without stenosis or occlusion. MCA bifurcations normal. Distal MCA branches well opacified and symmetric. POSTERIOR CIRCULATION: Vertebral arteries patent to the vertebrobasilar junction. Posterior inferior cerebral arteries patent. Basilar artery widely patent. Superior cerebellar arteries patent bilaterally. Both of the posterior cerebral arteries arise from the basilar artery and are well opacified to their distal aspects. No aneurysm or vascular malformation. IMPRESSION: MRI HEAD IMPRESSION: 1. No acute intracranial process. 2. Incidental right cerebellar DVA. 3. Right mastoid effusion. MRA HEAD IMPRESSION: Normal intracranial MRA. Electronically Signed   By: Rise MuBenjamin  McClintock M.D.   On: 06/05/2016 04:48   Mr Maxine GlennMra Head/brain Wo Cm  06/05/2016  CLINICAL DATA:  Initial evaluation for left-sided numbness, weakness. EXAM: MRI HEAD WITHOUT CONTRAST MRA HEAD WITHOUT CONTRAST TECHNIQUE: Multiplanar,  multiecho pulse sequences of the brain and surrounding structures were obtained without intravenous contrast. Angiographic images of the head were obtained using MRA technique without contrast. COMPARISON:  Prior CT from 06/04/2016. FINDINGS: MRI HEAD FINDINGS Cerebral volume within normal limits for patient age. No significant cerebral white matter disease. No abnormal foci of restricted diffusion to suggest acute ischemia. Gray-white matter differentiation maintained. Major intracranial vascular flow voids are preserved. No areas of chronic infarction identified. No mass lesion, midline shift, or mass effect. No hydrocephalus. No extra-axial fluid collection. Major dural sinuses are grossly patent. Incidental note made of a large DVA within the right cerebellar hemisphere. Craniocervical junction normal. Visualized upper cervical spine within normal limits without significant degenerative changes. Pituitary gland normal. No acute abnormality about the globes and orbits. Mild scattered mucosal thickening within ethmoidal air cells. Paranasal sinuses are otherwise clear. Right mastoid effusion present. Left mastoid air cells clear. Inner ear structures grossly normal. Bone marrow signal intensity within normal limits. No scalp soft tissue abnormality. MRA HEAD FINDINGS ANTERIOR CIRCULATION: Visualized distal cervical segments of the internal carotid arteries are patent with antegrade flow. Petrous, cavernous, and supraclinoid segments widely patent. A1 segments,  anterior communicating artery common anterior cerebral arteries well opacified. M1 segments patent without stenosis or occlusion. MCA bifurcations normal. Distal MCA branches well opacified and symmetric. POSTERIOR CIRCULATION: Vertebral arteries patent to the vertebrobasilar junction. Posterior inferior cerebral arteries patent. Basilar artery widely patent. Superior cerebellar arteries patent bilaterally. Both of the posterior cerebral arteries arise from  the basilar artery and are well opacified to their distal aspects. No aneurysm or vascular malformation. IMPRESSION: MRI HEAD IMPRESSION: 1. No acute intracranial process. 2. Incidental right cerebellar DVA. 3. Right mastoid effusion. MRA HEAD IMPRESSION: Normal intracranial MRA. Electronically Signed   By: Rise Mu M.D.   On: 06/05/2016 04:48   Ct Head Code Stroke W/o Cm  06/04/2016  CLINICAL DATA:  Code stroke. Acute onset of left hand numbness earlier today. EXAM: CT HEAD WITHOUT CONTRAST TECHNIQUE: Contiguous axial images were obtained from the base of the skull through the vertex without intravenous contrast. COMPARISON:  None. FINDINGS: Ventricular system normal in size and appearance for age. Mild cortical and deep atrophy. Mild changes of small vessel disease of the white matter diffusely. No mass lesion. No midline shift. No acute hemorrhage or hematoma. No extra-axial fluid collections. No evidence of acute cortical stroke. No skull fractures identified. No intrinsic osseous abnormality. Visualized paranasal sinuses well aerated. Nasal bones intact. Normal-appearing sella turcica. CT visualized paranasal sinuses well aerated. Opacification of right mastoid air cells. Left mastoid air cells and bilateral middle ear cavities well-aerated. IMPRESSION: 1. No acute intracranial abnormality. 2. 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 3. Right mastoid effusion. These results were called by telephone at the time of interpretation on 06/04/2016 at 7:52 pm to Dr. Roseanne Reno of Neurology, who verbally acknowledged these results. Electronically Signed   By: Hulan Saas M.D.   On: 06/04/2016 19:53        Scheduled Meds: . [START ON 06/06/2016] aspirin EC  81 mg Oral Daily  . [START ON 06/06/2016] enoxaparin (LOVENOX) injection  40 mg Subcutaneous Q24H   Continuous Infusions: . sodium chloride 150 mL/hr at 06/05/16 1237        Time  spent: 40 minutes.    [facility], MD [facility] Pager [phone]  If 7PM-7AM, please contact night-coverage [email or web address] Password [facility] [address] 06/05/2016, 4:27 PM

## 2016-06-05 NOTE — Progress Notes (Signed)
Echocardiogram 2D Echocardiogram has been performed.  Ethan Padilla, Ethan Padilla M 06/05/2016, 12:26 PM

## 2016-06-05 NOTE — Progress Notes (Signed)
STROKE TEAM PROGRESS NOTE   HISTORY OF PRESENT ILLNESS (per record) Ethan Padilla is an 75 y.o. male with no known medical illness and on no medications result in with acute onset of left hand and distal arm weakness at about 6:45 PM today 06/04/2016 ([facility]KW). 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 There was no change in speech and no weakness nor numbness of left lower extremity or left face. CT scan of his head showed no acute intracranial abnormality. NIH stroke score was 1 for mild drift of left upper extremity. He was not considered a candidate for TPA because of minimal deficits present. He was admitted for further evaluation and treatment.   SUBJECTIVE (INTERVAL HISTORY) His daughter is at the bedside. She called her sister and put her on speaker phone during MD rounds. He recounted his HPI.   Overall he feels his condition is completely resolved. Daughter's questions addressed    OBJECTIVE Temp:  [97.3 F (36.3 C)-98 F (36.7 C)] 97.7 F (36.5 C) (07/14 1032) Pulse Rate:  [69-90] 87 (07/14 1032) Cardiac Rhythm:  [-] Normal sinus rhythm (07/14 0700) Resp:  [15-23] 17 (07/14 1032) BP: (90-135)/(55-93) 95/64 mmHg (07/14 1032) SpO2:  [96 %-100 %] 97 % (07/14 1032) Weight:  [67.586 kg (149 lb)] 67.586 kg (149 lb) (07/13 1928)  CBC:  Recent Labs Lab 06/04/16 1941 06/04/16 1953 06/05/16 0010  WBC 9.8  --  7.2  NEUTROABS 7.9*  --   --   HGB 15.1 15.6 13.8  HCT 43.2 46.0 39.9  MCV 89.8  --  90.1  PLT 200  --  166    Basic Metabolic Panel:  Recent Labs Lab 06/04/16 1941 06/04/16 1953 06/05/16 0010  NA 125* 127* 130*  K 3.7 3.7 4.4  CL 89* 94* 96*  CO2 24  --  24  GLUCOSE 95 91 79  BUN 24* 25* 21*  CREATININE 1.53* 1.60* 1.25*  CALCIUM 9.8  --  9.2    Lipid Panel:    Component Value Date/Time   CHOL 163 06/05/2016 0010   TRIG 70 06/05/2016 0010   HDL 67 06/05/2016 0010   CHOLHDL 2.4 06/05/2016 0010   VLDL 14 06/05/2016  0010   LDLCALC 82 06/05/2016 0010   HgbA1c: No results found for: HGBA1C Urine Drug Screen: No results found for: LABOPIA, COCAINSCRNUR, LABBENZ, AMPHETMU, THCU, LABBARB    IMAGING  Dg Chest 2 View  06/05/2016  CLINICAL DATA:  75 year old male with left side weakness, TIA. Initial encounter. EXAM: CHEST  2 VIEW COMPARISON:  Brain MRI from today. FINDINGS: Mildly tortuous thoracic aorta. Other mediastinal contours are within normal limits. Normal lung volumes. No pneumothorax, pulmonary edema, pleural effusion or confluent pulmonary opacity. Mildly increased interstitial markings, nonspecific and likely chronic. No acute osseous abnormality identified. Negative visible bowel gas pattern. IMPRESSION: No acute cardiopulmonary abnormality. Electronically Signed   By: Odessa Fleming M.D.   On: 06/05/2016 07:16   Mr Brain Wo Contrast  06/05/2016  CLINICAL DATA:  Initial evaluation for left-sided numbness, weakness. EXAM: MRI HEAD WITHOUT CONTRAST MRA HEAD WITHOUT CONTRAST TECHNIQUE: Multiplanar, multiecho pulse sequences of the brain and surrounding structures were obtained without intravenous contrast. Angiographic images of the head were obtained using MRA technique without contrast. COMPARISON:  Prior CT from 06/04/2016. FINDINGS: MRI HEAD FINDINGS Cerebral volume within normal limits for patient age. No significant cerebral white matter disease. No abnormal foci of restricted diffusion to suggest acute  ischemia. Gray-white matter differentiation maintained. Major intracranial vascular flow voids are preserved. No areas of chronic infarction identified. No mass lesion, midline shift, or mass effect. No hydrocephalus. No extra-axial fluid collection. Major dural sinuses are grossly patent. Incidental note made of a large DVA within the right cerebellar hemisphere. Craniocervical junction normal. Visualized upper cervical spine within normal limits without significant degenerative changes. Pituitary gland normal.  No acute abnormality about the globes and orbits. Mild scattered mucosal thickening within ethmoidal air cells. Paranasal sinuses are otherwise clear. Right mastoid effusion present. Left mastoid air cells clear. Inner ear structures grossly normal. Bone marrow signal intensity within normal limits. No scalp soft tissue abnormality. MRA HEAD FINDINGS ANTERIOR CIRCULATION: Visualized distal cervical segments of the internal carotid arteries are patent with antegrade flow. Petrous, cavernous, and supraclinoid segments widely patent. A1 segments, anterior communicating artery common anterior cerebral arteries well opacified. M1 segments patent without stenosis or occlusion. MCA bifurcations normal. Distal MCA branches well opacified and symmetric. POSTERIOR CIRCULATION: Vertebral arteries patent to the vertebrobasilar junction. Posterior inferior cerebral arteries patent. Basilar artery widely patent. Superior cerebellar arteries patent bilaterally. Both of the posterior cerebral arteries arise from the basilar artery and are well opacified to their distal aspects. No aneurysm or vascular malformation. IMPRESSION: MRI HEAD IMPRESSION: 1. No acute intracranial process. 2. Incidental right cerebellar DVA. 3. Right mastoid effusion. MRA HEAD IMPRESSION: Normal intracranial MRA. Electronically Signed   By: Rise MuBenjamin  McClintock M.D.   On: 06/05/2016 04:48   Mr Maxine GlennMra Head/brain Wo Cm  06/05/2016  CLINICAL DATA:  Initial evaluation for left-sided numbness, weakness. EXAM: MRI HEAD WITHOUT CONTRAST MRA HEAD WITHOUT CONTRAST TECHNIQUE: Multiplanar, multiecho pulse sequences of the brain and surrounding structures were obtained without intravenous contrast. Angiographic images of the head were obtained using MRA technique without contrast. COMPARISON:  Prior CT from 06/04/2016. FINDINGS: MRI HEAD FINDINGS Cerebral volume within normal limits for patient age. No significant cerebral white matter disease. No abnormal foci of  restricted diffusion to suggest acute ischemia. Gray-white matter differentiation maintained. Major intracranial vascular flow voids are preserved. No areas of chronic infarction identified. No mass lesion, midline shift, or mass effect. No hydrocephalus. No extra-axial fluid collection. Major dural sinuses are grossly patent. Incidental note made of a large DVA within the right cerebellar hemisphere. Craniocervical junction normal. Visualized upper cervical spine within normal limits without significant degenerative changes. Pituitary gland normal. No acute abnormality about the globes and orbits. Mild scattered mucosal thickening within ethmoidal air cells. Paranasal sinuses are otherwise clear. Right mastoid effusion present. Left mastoid air cells clear. Inner ear structures grossly normal. Bone marrow signal intensity within normal limits. No scalp soft tissue abnormality. MRA HEAD FINDINGS ANTERIOR CIRCULATION: Visualized distal cervical segments of the internal carotid arteries are patent with antegrade flow. Petrous, cavernous, and supraclinoid segments widely patent. A1 segments, anterior communicating artery common anterior cerebral arteries well opacified. M1 segments patent without stenosis or occlusion. MCA bifurcations normal. Distal MCA branches well opacified and symmetric. POSTERIOR CIRCULATION: Vertebral arteries patent to the vertebrobasilar junction. Posterior inferior cerebral arteries patent. Basilar artery widely patent. Superior cerebellar arteries patent bilaterally. Both of the posterior cerebral arteries arise from the basilar artery and are well opacified to their distal aspects. No aneurysm or vascular malformation. IMPRESSION: MRI HEAD IMPRESSION: 1. No acute intracranial process. 2. Incidental right cerebellar DVA. 3. Right mastoid effusion. MRA HEAD IMPRESSION: Normal intracranial MRA. Electronically Signed   By: Rise MuBenjamin  McClintock M.D.   On: 06/05/2016 04:48   Ct  Head Code Stroke  W/o Cm  06/04/2016  CLINICAL DATA:  Code stroke. Acute onset of left hand numbness earlier today. EXAM: CT HEAD WITHOUT CONTRAST TECHNIQUE: Contiguous axial images were obtained from the base of the skull through the vertex without intravenous contrast. COMPARISON:  None. FINDINGS: Ventricular system normal in size and appearance for age. Mild cortical and deep atrophy. Mild changes of small vessel disease of the white matter diffusely. No mass lesion. No midline shift. No acute hemorrhage or hematoma. No extra-axial fluid collections. No evidence of acute cortical stroke. No skull fractures identified. No intrinsic osseous abnormality. Visualized paranasal sinuses well aerated. Nasal bones intact. Normal-appearing sella turcica. CT visualized paranasal sinuses well aerated. Opacification of right mastoid air cells. Left mastoid air cells and bilateral middle ear cavities well-aerated. IMPRESSION: 1. No acute intracranial abnormality. 2. 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 3. Right mastoid effusion. These results were called by telephone at the time of interpretation on 06/04/2016 at 7:52 pm to Dr. Roseanne Reno of Neurology, who verbally acknowledged these results. Electronically Signed   By: Hulan Saas M.D.   On: 06/04/2016 19:53   Carotid Doppler   There is 1-39% bilateral ICA stenosis. Vertebral artery flow is antegrade.      PHYSICAL EXAM Pleasant elderly Caucasian male currently not in distress. . Afebrile. Head is nontraumatic. Neck is supple without bruit.    Cardiac exam no murmur or gallop. Lungs are clear to auscultation. Distal pulses are well felt. Neurological Exam :  Awake  Alert oriented x 3. Normal speech and language.eye movements full without nystagmus.fundi were not visualized. Vision acuity and fields appear normal. Hearing is normal. Palatal movements are normal. Face symmetric. Tongue midline. Normal strength, tone, reflexes  and coordination. Normal sensation. Gait deferred.  ASSESSMENT/PLAN Mr. Jean Skow is a 75 y.o. male with no significant past medical history presenting with L arm/hand numbness/weakness. He did not receive IV t-PA due to minimal deficits.   Possible TIA vs dhydration   Intermittent tingling L arm  Resultant  Neuro deficits resolved  MRI  No acute stroke  MRA  Unremarkable   MRI CS without ordered  EEG ordered  Carotid Doppler  No significant stenosis   2D Echo  pending   LDL 82  HgbA1c pending  Lovenox 30 mg sq daily for VTE prophylaxis  Diet Heart Room service appropriate?: Yes; Fluid consistency:: Thin  No antithrombotic prior to admission, now on aspirin 325 mg daily. Discharge on aspirin 81 mg daily for stroke prevention  Patient counseled to be compliant with his antithrombotic medications  Ongoing aggressive stroke risk factor management  Therapy recommendations:  No therapy needs  Disposition:  Return home  Other Stroke Risk Factors  Advanced age  ETOH use, advised to drink no more than 2 drink(s) a day - does not drink daily  Snores, no formal dx of obstructive sleep apnea. Can do OP evaluation  Hospital day #   Rhoderick Moody [facility] See Amion for Pager information 06/05/2016 1:17 PM  I have personally examined this patient, reviewed notes, independently viewed imaging studies, participated in medical decision making and plan of care. I have made any additions or clarifications directly to the above note. Agree with note above.  He presented with transient recurrent left upper extremity tingling and numbness of unclear etiology. Right brain TIA is less likely given absence of any associated symptoms. Recommend start aspirin 81 mg daily and check MRI  scan of the cervical spine and EEG study. Greater than 50% time during this 25 minute visit was spent on counseling and coordination of care about his hand tingling and numbness in discussion  with daughters x2   Delia Heady, MD Medical Director [facility] Pager: [phone] 06/05/2016 5:33 PM    To contact Stroke Continuity provider, please refer to [email or web address]. After hours, contact General Neurology

## 2016-06-05 NOTE — Consult Note (Signed)
Cardiology Consult    Patient ID: Ethan Padilla MRN: [id number], DOB/AGE: 73/01/1941   Admit date: 06/04/2016 Date of Consult: 06/05/2016  Primary Physician: No primary care provider on file. Reason for Consult: PVC's; NSVT Primary Cardiologist: New Requesting Provider: Dr. Al CorpusHongali   History of Present Illness    Ethan FerrierHugh Moxon is a 75 y.o. male with no significant past medical history who presented to [facility] on 06/04/2016 for numbness of his left hand.  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 He took several breaks throughout the day and reports feeling well throughout the day. Upon returning inside that evening, he developed a numbness along his left hand. Denies any associated speech abnormalities or numbness along other parts of his body. He denies any chest pain, palpitations, or dyspnea with exertion throughout the day.   He denies any prior cardiac history, HTN, or HLD. No prior MI's. No family history of CAD. No current or prior tobacco use. He is very active at baseline, walking several miles per day while working in [place]. Also goes to gym on a daily basis, performing aerobic activities and weight lifting. Denies any recent anginal symptoms with these activities.    Reports having less than 3 episodes of chest discomfort in the past several years. He developed a shooting pain lasting only seconds a few months ago when boarding an airplane, but this was quickly relived with cold water.  Had an exercise stress test 2 years ago which he says was normal. Developed an "abnormal" heart rhythm toward the end but is unsure what this was. Was told by his PCP he did not need follow-up for this.   While in the ED, CT Imaging showed no acute intracranial abnormalities. MRI further obtained and showed no acute intracranial process. Na+ 125, K+ 3.7, creatinine 1.53 (unknown baseline). Initial  troponin negative. Lipid Panel with Total Cholesterol 163, HDL 67, LDL 82. EKG shows NSR, HR 85, and frequent PVC's. An echo shows EF of 50-55% with Grade 1 DD, and no wall motion abnormalities.  Past Medical History   Past Medical History  Diagnosis Date  . Male pattern baldness   . No family history of cardiac disease     Past Surgical History  Procedure Laterality Date  . Appendectomy       Allergies  No Known Allergies  Inpatient Medications    . [START ON 06/06/2016] aspirin EC  81 mg Oral Daily  . [START ON 06/06/2016] enoxaparin (LOVENOX) injection  40 mg Subcutaneous Q24H    Family History    Family History  Problem Relation Age of Onset  . Heart attack Neg Hx     No family history of CAD. Father lived into his 4190's.    Social History    Social History   Social History  . Marital Status: Married    Spouse Name: N/A  . Number of Children: N/A  . Years of Education: N/A   Occupational History  . Transport plannerolicy manager for [employer]    Social History Main Topics  . Smoking status: Never Smoker   . Smokeless tobacco: Not on file  . Alcohol Use: Yes     Comment: occasional  . Drug Use: No  . Sexual Activity: Not on file   Other Topics Concern  . Not on file   Social History Narrative   [place]     Review of Systems    General:  No chills, fever, night sweats or weight changes.  Cardiovascular:  No chest pain, dyspnea on exertion, edema, orthopnea, palpitations, paroxysmal nocturnal dyspnea. Dermatological: No rash, lesions/masses Respiratory: No cough, dyspnea Urologic: No hematuria, dysuria Abdominal:   No nausea, vomiting, diarrhea, bright red blood per rectum, melena, or hematemesis Neurologic:  No visual changes, wkns, changes in mental status. Positive for left hand numbness. All other systems reviewed and are otherwise negative except as noted above.  Physical Exam    Blood pressure 95/64, pulse 87, temperature 97.7 F  (36.5 C), temperature source Oral, resp. rate 17, height  (1.702 m), weight 149 lb (67.586 kg), SpO2 97 %.  General: Pleasant, Caucasian male appearing in NAD. Psych: Normal affect. Neuro: Alert and oriented X 3. Moves all extremities spontaneously. HEENT: Normal  Neck: Supple without bruits or JVD. Lungs:  Resp regular and unlabored, CTA without wheezing or rales. Heart: RRR no s3, s4, or murmurs. Abdomen: Soft, non-tender, non-distended, BS + x 4.  Extremities: No clubbing, cyanosis or edema. DP/PT/Radials 2+ and equal bilaterally.  Labs    Troponin [facility] of Care Test)  Recent Labs  06/04/16 1950  TROPIPOC 0.01    Recent Labs  06/04/16 1941 06/05/16 0755  CKTOTAL 1108* 1136*   Lab Results  Component Value Date   WBC 7.2 06/05/2016   HGB 13.8 06/05/2016   HCT 39.9 06/05/2016   MCV 90.1 06/05/2016   PLT 166 06/05/2016     Recent Labs Lab 06/05/16 0010 06/05/16 0755  NA 130*  --   K 4.4  --   CL 96*  --   CO2 24  --   BUN 21*  --   CREATININE 1.25*  --   CALCIUM 9.2  --   PROT  --  6.6  BILITOT  --  1.1  ALKPHOS  --  75  ALT  --  23  AST  --  42*  GLUCOSE 79  --    Lab Results  Component Value Date   CHOL 163 06/05/2016   HDL 67 06/05/2016   LDLCALC 82 06/05/2016   TRIG 70 06/05/2016   No results found for: [facility]   Radiology Studies    Dg Chest 2 View  06/05/2016  CLINICAL DATA:  75 year old male with left side weakness, TIA. Initial encounter. EXAM: CHEST  2 VIEW COMPARISON:  Brain MRI from today. FINDINGS: Mildly tortuous thoracic aorta. Other mediastinal contours are within normal limits. Normal lung volumes. No pneumothorax, pulmonary edema, pleural effusion or confluent pulmonary opacity. Mildly increased interstitial markings, nonspecific and likely chronic. No acute osseous abnormality identified. Negative visible bowel gas pattern. IMPRESSION: No acute cardiopulmonary abnormality. Electronically Signed   By: Odessa Fleming M.D.   On:  06/05/2016 07:16   Mr Brain Wo Contrast  06/05/2016  CLINICAL DATA:  Initial evaluation for left-sided numbness, weakness. EXAM: MRI HEAD WITHOUT CONTRAST MRA HEAD WITHOUT CONTRAST TECHNIQUE: Multiplanar, multiecho pulse sequences of the brain and surrounding structures were obtained without intravenous contrast. Angiographic images of the head were obtained using MRA technique without contrast. COMPARISON:  Prior CT from 06/04/2016. FINDINGS: MRI HEAD FINDINGS Cerebral volume within normal limits for patient age. No significant cerebral white matter disease. No abnormal foci of restricted diffusion to suggest acute ischemia. Gray-white matter differentiation maintained. Major intracranial vascular flow voids are preserved. No areas of chronic infarction identified. No mass lesion, midline shift, or mass effect. No hydrocephalus.  No extra-axial fluid collection. Major dural sinuses are grossly patent. Incidental note made of a large DVA within the right cerebellar hemisphere. Craniocervical junction normal. Visualized upper cervical spine within normal limits without significant degenerative changes. Pituitary gland normal. No acute abnormality about the globes and orbits. Mild scattered mucosal thickening within ethmoidal air cells. Paranasal sinuses are otherwise clear. Right mastoid effusion present. Left mastoid air cells clear. Inner ear structures grossly normal. Bone marrow signal intensity within normal limits. No scalp soft tissue abnormality. MRA HEAD FINDINGS ANTERIOR CIRCULATION: Visualized distal cervical segments of the internal carotid arteries are patent with antegrade flow. Petrous, cavernous, and supraclinoid segments widely patent. A1 segments, anterior communicating artery common anterior cerebral arteries well opacified. M1 segments patent without stenosis or occlusion. MCA bifurcations normal. Distal MCA branches well opacified and symmetric. POSTERIOR CIRCULATION: Vertebral arteries patent  to the vertebrobasilar junction. Posterior inferior cerebral arteries patent. Basilar artery widely patent. Superior cerebellar arteries patent bilaterally. Both of the posterior cerebral arteries arise from the basilar artery and are well opacified to their distal aspects. No aneurysm or vascular malformation. IMPRESSION: MRI HEAD IMPRESSION: 1. No acute intracranial process. 2. Incidental right cerebellar DVA. 3. Right mastoid effusion. MRA HEAD IMPRESSION: Normal intracranial MRA. Electronically Signed   By: Rise Mu M.D.   On: 06/05/2016 04:48   Mr Maxine Glenn Head/brain Wo Cm  06/05/2016  CLINICAL DATA:  Initial evaluation for left-sided numbness, weakness. EXAM: MRI HEAD WITHOUT CONTRAST MRA HEAD WITHOUT CONTRAST TECHNIQUE: Multiplanar, multiecho pulse sequences of the brain and surrounding structures were obtained without intravenous contrast. Angiographic images of the head were obtained using MRA technique without contrast. COMPARISON:  Prior CT from 06/04/2016. FINDINGS: MRI HEAD FINDINGS Cerebral volume within normal limits for patient age. No significant cerebral white matter disease. No abnormal foci of restricted diffusion to suggest acute ischemia. Gray-white matter differentiation maintained. Major intracranial vascular flow voids are preserved. No areas of chronic infarction identified. No mass lesion, midline shift, or mass effect. No hydrocephalus. No extra-axial fluid collection. Major dural sinuses are grossly patent. Incidental note made of a large DVA within the right cerebellar hemisphere. Craniocervical junction normal. Visualized upper cervical spine within normal limits without significant degenerative changes. Pituitary gland normal. No acute abnormality about the globes and orbits. Mild scattered mucosal thickening within ethmoidal air cells. Paranasal sinuses are otherwise clear. Right mastoid effusion present. Left mastoid air cells clear. Inner ear structures grossly normal.  Bone marrow signal intensity within normal limits. No scalp soft tissue abnormality. MRA HEAD FINDINGS ANTERIOR CIRCULATION: Visualized distal cervical segments of the internal carotid arteries are patent with antegrade flow. Petrous, cavernous, and supraclinoid segments widely patent. A1 segments, anterior communicating artery common anterior cerebral arteries well opacified. M1 segments patent without stenosis or occlusion. MCA bifurcations normal. Distal MCA branches well opacified and symmetric. POSTERIOR CIRCULATION: Vertebral arteries patent to the vertebrobasilar junction. Posterior inferior cerebral arteries patent. Basilar artery widely patent. Superior cerebellar arteries patent bilaterally. Both of the posterior cerebral arteries arise from the basilar artery and are well opacified to their distal aspects. No aneurysm or vascular malformation. IMPRESSION: MRI HEAD IMPRESSION: 1. No acute intracranial process. 2. Incidental right cerebellar DVA. 3. Right mastoid effusion. MRA HEAD IMPRESSION: Normal intracranial MRA. Electronically Signed   By: Rise Mu M.D.   On: 06/05/2016 04:48   Ct Head Code Stroke W/o Cm  06/04/2016  CLINICAL DATA:  Code stroke. Acute onset of left hand numbness earlier today. EXAM: CT HEAD WITHOUT CONTRAST TECHNIQUE: Contiguous  axial images were obtained from the base of the skull through the vertex without intravenous contrast. COMPARISON:  None. FINDINGS: Ventricular system normal in size and appearance for age. Mild cortical and deep atrophy. Mild changes of small vessel disease of the white matter diffusely. No mass lesion. No midline shift. No acute hemorrhage or hematoma. No extra-axial fluid collections. No evidence of acute cortical stroke. No skull fractures identified. No intrinsic osseous abnormality. Visualized paranasal sinuses well aerated. Nasal bones intact. Normal-appearing sella turcica. CT visualized paranasal sinuses well aerated. Opacification of  right mastoid air cells. Left mastoid air cells and bilateral middle ear cavities well-aerated. IMPRESSION: 1. No acute intracranial abnormality. 2. 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 3. Right mastoid effusion. These results were called by telephone at the time of interpretation on 06/04/2016 at 7:52 pm to Dr. Roseanne Reno of Neurology, who verbally acknowledged these results. Electronically Signed   By: Hulan Saas M.D.   On: 06/04/2016 19:53   EKG & Cardiac Imaging    EKG: NSR, HR 85, LAD and frequent PVC's. Mild q-waves present in inferior leads.  Echocardiogram: 06/05/2016 Study Conclusions  - Left ventricle: The cavity size was normal. Wall thickness was  normal. Systolic function was normal. The estimated ejection  fraction was in the range of 50% to 55%. Doppler parameters are  consistent with abnormal left ventricular relaxation (grade 1  diastolic dysfunction). 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 - Right ventricle: Systolic function was mildly reduced.  Assessment & Plan    1. Nonsustained Ventricular Tachycardia/PVC's - presented with left-hand numbness. CODE STROKE activated but CT and MRI imaging have been negative.  - Cardiology consulted for abnormal rhythms on telemetry. Some strips are labeled as irregular, but p-waves are present. He does have frequent PVC's. At 1258 today, he had a 14 beat run of likely NSVT. He denies any symptoms at that time. - no prior cardiac history. No known risk factors of HTN, HLD, Type 2 DM, family history of CAD, or tobacco use. He is very active at baseline to be 75 yo and denies any recent anginal symptoms. - echo shows an EF of 50-55% with Grade 1 DD, and no wall motion abnormalities. EKG without acute ischemic changes, possible q-waves in inferior leads. - the patient is visiting from out of town. Recommended to the patient he reestablish care with a Cardiologist in [place]  or [place] and receive a 30-day event monitor upon returning to [place], [place]. Would also recommend a nuclear stress test with his significant ectopy and NSVT. Dr. Katrinka Blazing recommend continuing ASA and considering BB following his 30-day event monitor so the medication will not interfere with revealing any irregularities.  2. AKI - creatinine 1.53 on admission, improved to 1.25 currently. - per admitting team    Signed, Ellsworth Lennox, PA-C 06/05/2016, 4:59 PM Pager: [phone] The patient has been seen in conjunction with Randall An, PA-C. All aspects of care have been considered and discussed. The patient has been personally interviewed, examined, and all clinical data has been reviewed.   75 year old gentleman presenting with vague neurological complaints, hyponatremia, and acute kidney injury.  We will asked to consult because he developed nonsustained wide complex tachycardia (asymptomatic).  He currently feels well. His daughters in the room during the interview and exam. He is Right cardiac evaluation with at least 2 exercise treadmill test in the past however most recently greater than 10 years ago. They were done as  screening and not because of symptoms.  Last evening on the monitor he had 14 beats of wide complex tachycardia with varying RR interval representing either ventricular tachycardia or atrial fibrillation with aberration.  Exam and cardiac markers normal.  EKG is abnormal with a suggestion of inferolateral infarction and also premature ventricular contractions. There is no known prior history of an abnormal EKG per the patient. Troponin was negative. CPK was elevated likely related to rhabdomyolysis.  Echocardiography during this admission is essentially normal  Overall, the patient is stable from cardiac standpoint. He may have underlying ischemic heart disease. His 12-lead EKG seems this suggests the presence of prior infarction. He does give a  vague history of intermittent episodes of chest pressure that are relieved by a cold glass of water. There is a 3 year history of such complaints. Prior stress tests predating these complaints were "normal". We don't feel the current presentation with left arm weakness is cardiac related. The most likely explanation is hyponatremia.  We recommend: 1. Give patient copies of his EKG and the abnormal rhythm strip to carry to his home cardiologist; 2. Thirty-day continuous ambulatory monitor to rule out atrial fibrillation versus recurrent VT; 3. Stress myocardial perfusion imaging to exclude ischemia/infarction; 4. Aspirin 81 mg per day.  In absence of cardiopulmonary complaints, we feel this workup can be performed after he returns home but needs to be done expediently.

## 2016-06-05 NOTE — Progress Notes (Signed)
Patient back from MRI.

## 2016-06-05 NOTE — Progress Notes (Signed)
Patient transported to MRI 

## 2016-06-05 NOTE — Progress Notes (Signed)
*  PRELIMINARY RESULTS* Vascular Ultrasound Carotid Duplex (Doppler) has been completed.  Preliminary findings: Bilateral: No significant ICA stenosis. Antegrade vertebral flow.    Farrel DemarkJill Eunice, RDMS, RVT  06/05/2016, 11:26 AM

## 2016-06-05 NOTE — Evaluation (Signed)
Physical Therapy Evaluation Patient Details Name: Ethan Padilla MRN: [id number] DOB: 1941-10-16 Today's Date: 06/05/2016   History of Present Illness  75 yo male admitted with L arm / hand numbness MRI (-) CT (-) continued workup for TIA PMH: appendectomy  Clinical Impression  Pt is at or close to baseline functioning and should be safe at home . There are no further acute PT needs.  Will sign off at this time.     Follow Up Recommendations No PT follow up;Other (comment) (unless cervical involvement, may need OPPT)    Equipment Recommendations  None recommended by PT    Recommendations for Other Services       Precautions / Restrictions Precautions Precautions: None      Mobility  Bed Mobility Overal bed mobility: Independent                Transfers Overall transfer level: Independent                  Ambulation/Gait Ambulation/Gait assistance: Independent         Gait velocity interpretation: >2.62 ft/sec, indicative of independent community ambulator General Gait Details: smooth and fluid  Stairs Stairs: Yes Stairs assistance: Independent Stair Management: No rails;Alternating pattern;Forwards Number of Stairs: 5 General stair comments: safe and fluid of movement  Wheelchair Mobility    Modified Rankin (Stroke Patients Only)       Balance Overall balance assessment: No apparent balance deficits (not formally assessed)                               Standardized Balance Assessment Standardized Balance Assessment : Berg Balance Test;Dynamic Gait Index Berg Balance Test Sit to Stand: Able to stand without using hands and stabilize independently Standing Unsupported: Able to stand safely 2 minutes Sitting with Back Unsupported but Feet Supported on Floor or Stool: Able to sit safely and securely 2 minutes Stand to Sit: Sits safely with minimal use of hands Transfers: Able to transfer safely, minor use of hands Standing  Unsupported with Eyes Closed: Able to stand 10 seconds safely Standing Ubsupported with Feet Together: Able to place feet together independently and stand 1 minute safely From Standing, Reach Forward with Outstretched Arm: Can reach confidently >25 cm (10") From Standing Position, Pick up Object from Floor: Able to pick up shoe safely and easily From Standing Position, Turn to Look Behind Over each Shoulder: Looks behind from both sides and weight shifts well Turn 360 Degrees: Able to turn 360 degrees safely in 4 seconds or less Standing Unsupported, Alternately Place Feet on Step/Stool: Able to stand independently and safely and complete 8 steps in 20 seconds Standing Unsupported, One Foot in Front: Able to place foot tandem independently and hold 30 seconds Standing on One Leg: Able to lift leg independently and hold > 10 seconds Total Score: 56 Dynamic Gait Index Level Surface: Normal Change in Gait Speed: Normal Gait with Horizontal Head Turns: Normal Gait with Vertical Head Turns: Normal Gait and Pivot Turn: Normal Step Over Obstacle: Normal Step Around Obstacles: Normal Steps: Normal Total Score: 24       Pertinent Vitals/Pain Pain Assessment: No/denies pain    Home Living Family/patient expects to be discharged to:: Private residence Living Arrangements: Spouse/significant other;Children Available Help at Discharge: Family Type of Home: Apartment Home Access: Elevator;Stairs to enter   Secretary/administrator of Steps: 2 Home Layout: Two level Home Equipment: None Additional Comments: Pt has  3 homes that he stays at and when asked which he will be at most he states "all of them equal" Pt will d/c to daughters home with stairs to enter and stairs for 2 floor. monday will fly to d/c and require 2 stairs to enter high rise condo with elevator and then will leave this location to go to family home that is old with multiple stairs to enter and to reach bedrooms    Prior  Function Level of Independence: Independent               Hand Dominance   Dominant Hand: Right    Extremity/Trunk Assessment   Upper Extremity Assessment: Overall WFL for tasks assessed           Lower Extremity Assessment: Overall WFL for tasks assessed (mildly weaker hip flexor on L vs R)      Cervical / Trunk Assessment: Normal  Communication   Communication: Other (comment) (baseline speech deficits)  Cognition Arousal/Alertness: Awake/alert Behavior During Therapy: WFL for tasks assessed/performed Overall Cognitive Status: Within Functional Limits for tasks assessed                      General Comments      Exercises        Assessment/Plan    PT Assessment Patent does not need any further PT services  PT Diagnosis     PT Problem List    PT Treatment Interventions     PT Goals (Current goals can be found in the Care Plan section) Acute Rehab PT Goals PT Goal Formulation: All assessment and education complete, DC therapy    Frequency     Barriers to discharge        Co-evaluation               End of Session   Activity Tolerance: Patient tolerated treatment well Patient left: in bed Nurse Communication: Mobility status    Functional Assessment Tool Used: clinical judgement Functional Limitation: Mobility: Walking and moving around Mobility: Walking and Moving Around Current Status (D6387(G8978): 0 percent impaired, limited or restricted Mobility: Walking and Moving Around Goal Status [phone](G8979): 0 percent impaired, limited or restricted Mobility: Walking and Moving Around Discharge Status [phone](G8980): 0 percent impaired, limited or restricted    Time: 1735-1802 PT Time Calculation (min) (ACUTE ONLY): 27 min   Charges:   PT Evaluation $PT Eval Low Complexity: 1 Procedure PT Treatments $Gait Training: 8-22 mins   PT G Codes:   PT G-Codes **NOT FOR INPATIENT CLASS** Functional Assessment Tool Used: clinical judgement Functional  Limitation: Mobility: Walking and moving around Mobility: Walking and Moving Around Current Status (A4166(G8978): 0 percent impaired, limited or restricted Mobility: Walking and Moving Around Goal Status (A6301(G8979): 0 percent impaired, limited or restricted Mobility: Walking and Moving Around Discharge Status (S0109(G8980): 0 percent impaired, limited or restricted    Emanuella Nickle, Eliseo GumKenneth Padilla 06/05/2016, 6:09 PM 06/05/2016  [place] BingKen Aubrielle Stroud, PT [phone]  (pager)

## 2016-06-06 DIAGNOSIS — G458 Other transient cerebral ischemic attacks and related syndromes: Secondary | ICD-10-CM

## 2016-06-06 DIAGNOSIS — M6282 Rhabdomyolysis: Secondary | ICD-10-CM

## 2016-06-06 DIAGNOSIS — R200 Anesthesia of skin: Secondary | ICD-10-CM

## 2016-06-06 DIAGNOSIS — R208 Other disturbances of skin sensation: Secondary | ICD-10-CM

## 2016-06-06 LAB — BASIC METABOLIC PANEL
ANION GAP: 7 (ref 5–15)
BUN: 7 mg/dL (ref 6–20)
CALCIUM: 8 mg/dL — AB (ref 8.9–10.3)
CHLORIDE: 112 mmol/L — AB (ref 101–111)
CO2: 24 mmol/L (ref 22–32)
Creatinine, Ser: 0.76 mg/dL (ref 0.61–1.24)
GFR calc non Af Amer: >60 mL/min (ref >60)
GLUCOSE: 84 mg/dL (ref 65–99)
POTASSIUM: 3.5 mmol/L (ref 3.5–5.1)
Sodium: 143 mmol/L (ref 135–145)

## 2016-06-06 LAB — HEMOGLOBIN A1C
Hgb A1c MFr Bld: 5.1 % (ref 4.8–5.6)
Mean Plasma Glucose: 100 mg/dL

## 2016-06-06 LAB — CK: Total CK: 528 U/L — ABNORMAL HIGH (ref 49–397)

## 2016-06-06 MED ORDER — SODIUM CHLORIDE 0.9 % IV SOLN
INTRAVENOUS | Status: DC
Start: 2016-06-06 — End: 2016-06-06
  Administered 2016-06-06: 09:00:00 via INTRAVENOUS

## 2016-06-06 MED ORDER — POTASSIUM CHLORIDE CRYS ER 20 MEQ PO TBCR: 40.0000 meq | EXTENDED_RELEASE_TABLET | Freq: Once | ORAL | Status: DC

## 2016-06-06 MED ORDER — ASPIRIN 81 MG PO TBEC: 81.0000 mg | DELAYED_RELEASE_TABLET | Freq: Every day | ORAL | Status: AC

## 2016-06-06 NOTE — Progress Notes (Addendum)
STROKE TEAM PROGRESS NOTE   HISTORY OF PRESENT ILLNESS (per record) Ethan Padilla is an 75 y.o. male with no known medical illness and on no medications result in with acute onset of left hand and distal arm weakness at about 6:45 PM today 06/04/2016 ([facility]). 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 There was no change in speech and no weakness nor numbness of left lower extremity or left face. CT scan of his head showed no acute intracranial abnormality. NIH stroke score was 1 for mild drift of left upper extremity. He was not considered a candidate for TPA because of minimal deficits present. He was admitted for further evaluation and treatment.   SUBJECTIVE (INTERVAL HISTORY) His daughter is at the bedside. She called her sister and put her on speaker phone during MD rounds. He recounted his HPI.   Overall he feels his condition is completely resolved. Daughter's questions addressed.  We discussed vascular risk factors and reviewed the MRI results Reports only slight numbness in left hand now   OBJECTIVE Temp:  [97.7 F (36.5 C)-98.6 F (37 C)] 98.6 F (37 C) (07/15 0552) Pulse Rate:  [65-87] 66 (07/15 0552) Cardiac Rhythm:  [-] Sinus bradycardia (07/15 0721) Resp:  [16-18] 18 (07/15 0552) BP: (92-103)/(49-65) 103/49 mmHg (07/15 0552) SpO2:  [97 %-100 %] 100 % (07/15 0552)  CBC:   Recent Labs Lab 06/04/16 1941 06/04/16 1953 06/05/16 0010  WBC 9.8  --  7.2  NEUTROABS 7.9*  --   --   HGB 15.1 15.6 13.8  HCT 43.2 46.0 39.9  MCV 89.8  --  90.1  PLT 200  --  166    Basic Metabolic Panel:   Recent Labs Lab 06/05/16 1815 06/06/16 0500  NA 137 143  K 3.6 3.5  CL 109 112*  CO2 25 24  GLUCOSE 98 84  BUN 9 7  CREATININE 0.77 0.76  CALCIUM 8.3* 8.0*  MG 2.0  --     Lipid Panel:     Component Value Date/Time   CHOL 163 06/05/2016 0010   TRIG 70 06/05/2016 0010   HDL 67 06/05/2016 0010   CHOLHDL 2.4 06/05/2016 0010   VLDL 14  06/05/2016 0010   LDLCALC 82 06/05/2016 0010   HgbA1c: No results found for: HGBA1C Urine Drug Screen: No results found for: LABOPIA, COCAINSCRNUR, LABBENZ, AMPHETMU, THCU, LABBARB    IMAGING Dg Chest 2 View 06/05/2016   No acute cardiopulmonary abnormality.     Ethan Ethan Padilla Head/brain Wo Padilla 06/05/2016    MRI HEAD  1. No acute intracranial process.  2. Incidental right cerebellar DVA.  3. Right mastoid effusion.   MRA HEAD   Normal intracranial MRA.    Ethan Padilla 06/05/2016   1. 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  2. Mild bilateral C5-6 neural foraminal narrowing.   Ct Head Code Stroke W/o Padilla 06/04/2016   1. No acute intracranial abnormality.  2. 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  3. Right mastoid effusion.   Carotid Doppler   There is 1-39% bilateral ICA stenosis. Vertebral artery flow is antegrade.     PHYSICAL EXAM Pleasant elderly Caucasian male currently not in distress. . Afebrile. Head is nontraumatic. Neck is supple without bruit.    Cardiac exam no murmur or gallop. Lungs are clear to  auscultation. Distal pulses are well felt.  Neurological Exam :  Awake  Alert oriented x 3. Normal speech and language.eye movements full without nystagmus.fundi were not visualized. Vision acuity and fields appear normal. Hearing is normal. Palatal movements are normal. Face symmetric. Tongue midline. Normal strength, tone, reflexes and coordination. Normal sensation. Gait deferred.  ASSESSMENT/PLAN Ethan. Ethan Padilla is a 75 y.o. male with no significant past medical history presenting with L arm/hand numbness/weakness. He did not receive IV t-PA due to minimal deficits.   Possible TIA vs dhydration   Intermittent tingling L arm  Resultant  Neuro deficits resolved  MRI  No acute stroke  MRA   Unremarkable   MRI CS without ordered  EEG ordered  Carotid Doppler  No significant stenosis   2D Echo  pending   LDL 82  HgbA1c pending  Lovenox 30 mg sq daily for VTE prophylaxis Diet Heart Room service appropriate?: Yes; Fluid consistency:: Thin  No antithrombotic prior to admission, now on aspirin 325 mg daily. Discharge on aspirin 81 mg daily for stroke prevention  Patient counseled to be compliant with his antithrombotic medications  Ongoing aggressive stroke risk factor management  Therapy recommendations:  No therapy needs  Disposition:  Return home  Other Stroke Risk Factors  Advanced age  ETOH use, advised to drink no more than 2 drink(s) a day - does not drink daily  Snores, no formal dx of obstructive sleep apnea. Can do OP evaluation  Hospital day # 1  DAVID L Merryl HackerINEHULS  [facility] See Amion for Pager information 06/06/2016 8:30 AM     I have personally examined this patient, reviewed notes, independently viewed imaging studies, participated in medical decision making and plan of care. I have made any additions or clarifications directly to the above note. Agree with note above.  He presented with transient recurrent left upper extremity tingling and numbness of unclear etiology. Right brain TIA is a part of the possible differential diagnosis.  We discussed the other possibility of the foraminal stenoses as part of the event.  The collective decision was:   Discharge on ASA 81mg  daily  Low dose statin to aim for goal LDL<70  Follow-up with PCP in home state.  Plan for sleep study and 30-day heart monitor.  PCP may also consider internal heart monitor if patient has additional symptoms  Signed:  Dr. Sula Sodahere Mahsa Hanser  To contact Stroke Continuity provider, please refer to [email or web address]. After hours, contact General Neurology

## 2016-06-06 NOTE — Discharge Instructions (Signed)
As per cardiology recommendations: 1. Provided patient copies of his EKG and the abnormal rhythm strip to carry to his home cardiologist; 2. Recommended Thirty-day continuous ambulatory monitor to rule out atrial fibrillation versus recurrent VT; 3. Recommended Stress myocardial perfusion imaging to exclude ischemia/infarction; 4. Aspirin 81 mg per day.In absence of cardiopulmonary complaints, Cardiology feels this workup can be performed after he returns home but needs to be done expediently.  Rhabdomyolysis Rhabdomyolysis is a condition that results when muscle cells break down and release substances into the blood that can damage the kidneys. It happens because of damage to the muscles that move bones (skeletal muscle). When you damage this type of muscle, substances inside of your muscle cells are released into your blood. This includes a certain protein called myoglobin. Your kidneys must filter myoglobin from your blood. Large amounts of myoglobin can cause kidney damage or kidney failure. Other substances that are released by muscle cells may upset the balance of the minerals (electrolytes) in your blood. This makes your blood become too acidic (acidosis). CAUSES This condition is caused by muscle damage. Muscle damage often results from:  Extreme overuse of the muscles.  An injury that crushes or compresses a muscle.  Use of illegal drugs, especially cocaine.  Alcohol abuse. Other possible causes include:  Prescription medicines, such as statins, amphetamines, and opiates.  Infections.  Inherited muscle diseases.  High fever.  Heatstroke.  Dehydration.  Seizures.  Surgery. RISK FACTORS This condition is more likely to develop in:  People who have a family history of muscle disease.  People who participate in extreme sports, such as marathon running.  People who have diabetes.  Older people.  People who abuse drugs or alcohol. SYMPTOMS Symptoms of this condition  vary. Some people have very few symptoms, while others have many symptoms. The most common symptoms include:  Muscle pain and swelling.  Muscle weakness.  Dark urine.  Feeling weak and tired. Other symptoms include:  Nausea and vomiting.  Fever.  Pain in the abdomen.  Joint pain. Signs and symptoms of complications from rhabdomyolysis may include:  Heart rhythm abnormalities (arrhythmias).  Seizures.  Reduced urine production because of kidney failure.  Very low blood pressure (shock).  Uncontrolled bleeding. DIAGNOSIS This condition may be diagnosed based on:  Your symptoms and medical history.  A physical exam.  Blood tests to check for:  Muscle breakdown products in the blood (creatine kinase).  Myoglobin.  Acidosis.  Electrolyte imbalances.  Urine tests to check for myoglobin. You may also have other tests to check for causes of muscle damage and to check for complications. TREATMENT Treatment for this condition focuses on keeping up your fluid level, reversing acidosis, and protecting your kidneys. Treatment may include:  Fluids and medicines given through an IV tube that is inserted into one of your veins.  Medicines, such as:  Sodium bicarbonate to reduce acidosis.  Electrolytes to restore the balance of these minerals in your body.  Hemodialysis. This treatment uses an artificial kidney machine to filter your blood while you recover. You may have this if other treatments are not helping. HOME CARE INSTRUCTIONS  Take medicines only as directed by your health care provider.  Rest at home until your health care provider says that you can return to your normal activities.  Drink enough fluid to keep your urine clear or pale yellow.  Do not exercise with great energy and effort (strenuously). Ask your health care provider what level of exercise is safe for  you.  Do not abuse drugs or alcohol. If you are struggling with drug or alcohol use, ask  your health care provider for help.  Keep all follow-up visits as directed by your health care provider. This is important. SEEK MEDICAL CARE IF:  You develop symptoms of rhabdomyolysis at home after treatment. SEEK IMMEDIATE MEDICAL CARE IF:  You have a seizure.  You bleed easily or cannot control bleeding.  You cannot make urine.  You have chest pain.  You have trouble breathing.   This information is not intended to replace advice given to you by your health care provider. Make sure you discuss any questions you have with your health care provider.   Document Released: 10/22/2004 Document Revised: 03/26/2015 Document Reviewed: 11/14/2014 Elsevier Interactive Patient Education 2016 [employer]   Hyponatremia Hyponatremia is when the amount of salt (sodium) in your blood is too low. When sodium levels are low, your cells absorb extra water and they swell. The swelling happens throughout the body, but it mostly affects the brain. CAUSES This condition may be caused by:  Heart, kidney, or liver problems.  Thyroid problems.  Adrenal gland problems.  Metabolic conditions, such as syndrome of inappropriate antidiuretic hormone (SIADH).  Severe vomiting and diarrhea.  Certain medicines or illegal drugs.  Dehydration.  Drinking too much water.  Eating a diet that is low in sodium.  Large burns on your body.  Sweating. RISK FACTORS This condition is more likely to develop in people who:  Have long-term (chronic) kidney disease.  Have heart failure.  Have a medical condition that causes frequent or excessive diarrhea.  Have metabolic conditions, such as Addison disease or SIADH.  Take certain medicines that affect the sodium and fluid balance in the blood. Some of these medicine types include:  Diuretics.  NSAIDs.  Some opioid pain medicines.  Some antidepressants.  Some seizure prevention medicines. SYMPTOMS  Symptoms of this condition  include:  Nausea and vomiting.  Confusion.  Lethargy.  Agitation.  Headache.  Seizures.  Unconsciousness.  Appetite loss.  Muscle weakness and cramping.  Feeling weak or light-headed.  Having a rapid heart rate.  Fainting, in severe cases. DIAGNOSIS This condition is diagnosed with a medical history and physical exam. You will also have other tests, including:  Blood tests.  Urine tests. TREATMENT Treatment for this condition depends on the cause. Treatment may include:  Fluids given through an IV tube that is inserted into one of your veins.  Medicines to correct the sodium imbalance. If medicines are causing the condition, the medicines will need to be adjusted.  Limiting water or fluid intake to get the correct sodium balance. HOME CARE INSTRUCTIONS  Take medicines only as directed by your health care provider. Many medicines can make this condition worse. Talk with your health care provider about any medicines that you are currently taking.  Carefully follow a recommended diet as directed by your health care provider.  Carefully follow instructions from your health care provider about fluid restrictions.  Keep all follow-up visits as directed by your health care provider. This is important.  Do not drink alcohol. SEEK MEDICAL CARE IF:  You develop worsening nausea, fatigue, headache, confusion, or weakness.  Your symptoms go away and then return.  You have problems following the recommended diet. SEEK IMMEDIATE MEDICAL CARE IF:  You have a seizure.  You faint.  You have ongoing diarrhea or vomiting.   This information is not intended to replace advice given to you by  your health care provider. Make sure you discuss any questions you have with your health care provider.   Document Released: 10/30/2002 Document Revised: 03/26/2015 Document Reviewed: 11/29/2014 Elsevier Interactive Patient Education [employer]

## 2016-06-06 NOTE — Progress Notes (Signed)
Patient is being discharged home. Discharge instruction were given to patient and family 

## 2016-06-06 NOTE — Discharge Summary (Addendum)
Physician Discharge Summary  Ethan FerrierHugh Padilla ZOX:[id number] DOB: 02/05/1941  PCP: No primary care provider on file.  Admit date: 06/04/2016 Discharge date: 06/06/2016  Admitted From: Home Disposition:  Home  Recommendations for Outpatient Follow-up:  1. PCP[facility]Cardiologist at [facility] [place] in 3 days with repeat labs (CMP & CK). Rest of follow up recommendations as indicated below.  Home Health: None Equipment/Devices: None    Discharge Condition: Improved and stable.  CODE STATUS: Full  Diet recommendation: Heart healthy diet.  Discharge Diagnoses:  Principal Problem:   Left upper extremity numbness Active Problems:   Hyponatremia   AKI (acute kidney injury) (HCC)   PVC (premature ventricular contraction)   NSVT (nonsustained ventricular tachycardia) (HCC)   Brief/Interim Summary: Left upper extremity tingling, numbness and weakness - CT head 06/04/16: No acute intracranial abnormality. - MRI brain 06/05/16: No acute stroke - MRA brain 06/05/16: Unremarkable. - Carotid Dopplers: No significant ICA stenosis. - 2-D echo: LVEF 50-55 percent and grade 1 diastolic dysfunction, moderate MR. - LDL: 82. Goal LDL for TIA <70 and low-dose statin is recommended but patient wishes to pursue with his physicians in [place]. - Hemoglobin A1c: Pending. - No antithrombotic prior to admission, now on aspirin 81 MG daily. - No therapy needs identified. - NeuroHospitalist initially consulted in ED and were concerned about TIA and hence patient underwent extensive workup as above. Subsequently stroke service evaluated him and indicated that he presented with transient recurrent left upper extremity tingling and numbness of unclear etiology. Right brain TIA is less likely given absence of any associated symptoms (however right brain TIA & foraminal stenosis are part of the possible differential diagnosis). They recommended starting aspirin 81 MG daily, checking MRI  C-spine (shows degenerative/cervical spondylosis changes as per report below) and EEG (no seizures per preliminary verbal report from NeuroHospitalist today). - Left upper extremity symptoms have resolved. - Stroke MD has seen today and cleared patient for discharge.  Mild rhabdomyolysis - Likely secondary to physical exertion and working outside in hot weather. - Hydrated with IV fluids. Advised avoiding strenuous activity for the next week or 2.  - CK on admission 1108 which is improved to 528. Recommended continued ad lib. oral hydration and follow-up of CMP and CK early next week.  Hyponatremia - Presented with sodium of 125 - May be related to free water intake versus dehydration (patient apparently drank lots of cold ice water but nothing else and had been working outside in the sun, sweating all day). Hydrated with IV normal saline.  - Sodium improved gradually from 125 on admission to 143 over ~ 36 hours. -  Acute kidney injury - Likely secondary to dehydration and mild rhabdomyolysis. Presented with creatinine of 1.53. - Baseline creatinine unknown.  - Resolved.  Frequent PVCs/nonsustained wide complex tachycardia - Telemetry showed 15 beat nonsustained wide complex tachycardia on 7/14 at 12:58 PM. - Cardiology was consulted on 7/14. Discussed with Dr. Katrinka BlazingSmith. As per his input: Patient had 14 beats of wide complex tachycardia with varying R-R interval representing either ventricular tachycardia or atrial fibrillation with aberration. Examined cardiac markers normal. EKG is abnormal with a suggestion of inferior lateral infarction and also premature ventricular contractions. There is no known prior history of an abnormal EKG per the patient. Troponin was negative. CPK was elevated likely related to rhabdomyolysis. Echocardiogram during this admission is essentially normal. Overall the patient is stable from cardiac standpoint. He may have underlying ischemic heart disease. His 12-lead EKG  suggests the presence of prior infarction. He does give a vague history of intermittent episodes of chest pressure that are relieved by a cold glass of water. There is a 3 year history of such complaints. Prior stress testing predating these complaints where "normal". He did not feel the current presentation with left arm weakness was cardiac related. - As per cardiology recommendations: 1. Provided patient copies of his EKG and the abnormal rhythm strip to carry to his home cardiologist; 2. Thirty-day continuous ambulatory monitor to rule out atrial fibrillation versus recurrent VT; 3. Stress myocardial perfusion imaging to exclude ischemia/infarction; 4. Aspirin 81 mg per day. In absence of cardiopulmonary complaints, we feel this workup can be performed after he returns home but needs to be done expediently. As per Dr. Michaelle Copas discussion with patient, patient did not wish to have further evaluation done here and wanted to get this done in [place]. - Magnesium: 2. Replaced potassium prior to discharge today.  Snoring - May pursue evaluation for sleep apnea as outpatient.   Consultants:   Neurology  Cardiology  Procedures:   2-D echo 06/05/16: Study Conclusions  - Left ventricle: The cavity size was normal. Wall thickness was  normal. Systolic function was normal. The estimated ejection  fraction was in the range of 50% to 55%. Doppler parameters are  consistent with abnormal left ventricular relaxation (grade 1  diastolic dysfunction). 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 - Right ventricle: Systolic function was mildly reduced   Carotid Doppler 06/05/16: Summary: No significant extracranial carotid artery stenosis demonstrated. Vertebrals are patent with antegrade flow.   Discharge Instructions      Discharge Instructions    Call MD for:  extreme fatigue    Complete by:  As directed      Call MD for:  persistant dizziness or light-headedness     Complete by:  As directed      Call MD for:    Complete by:  As directed   Strokelike symptoms.     Diet - low sodium heart healthy    Complete by:  As directed      Increase activity slowly    Complete by:  As directed             Medication List    TAKE these medications        aspirin 81 MG EC tablet  Take 1 tablet (81 mg total) by mouth daily.     finasteride 1 MG tablet  Commonly known as:  PROPECIA  Take 1 mg by mouth daily.       Follow-up Information    Follow up with Family Doctor/Cardiologist at [facility], [place]. Schedule an appointment as soon as possible for a visit in 3 days.   Why:  to be seen with repeat labs (CMP, CK). Please have all your medical records from this hospitalization forwarded to your physicians in [place] for further evaluation.     No Known Allergies   Procedures/Studies: Dg Chest 2 View  06/05/2016  CLINICAL DATA:  75 year old male with left side weakness, TIA. Initial encounter. EXAM: CHEST  2 VIEW COMPARISON:  Brain MRI from today. FINDINGS: Mildly tortuous thoracic aorta. Other mediastinal contours are within normal limits. Normal lung volumes. No pneumothorax, pulmonary edema, pleural effusion or confluent pulmonary opacity. Mildly increased interstitial markings, nonspecific and likely chronic. No acute osseous abnormality identified. Negative visible bowel gas pattern. IMPRESSION: No acute cardiopulmonary abnormality. Electronically Signed   By:  Odessa Fleming M.D.   On: 06/05/2016 07:16   Mr Brain Wo Contrast  06/05/2016  CLINICAL DATA:  Initial evaluation for left-sided numbness, weakness. EXAM: MRI HEAD WITHOUT CONTRAST MRA HEAD WITHOUT CONTRAST TECHNIQUE: Multiplanar, multiecho pulse sequences of the brain and surrounding structures were obtained without intravenous contrast. Angiographic images of the head were obtained using MRA technique without contrast. COMPARISON:  Prior CT from 06/04/2016.  FINDINGS: MRI HEAD FINDINGS Cerebral volume within normal limits for patient age. No significant cerebral white matter disease. No abnormal foci of restricted diffusion to suggest acute ischemia. Gray-white matter differentiation maintained. Major intracranial vascular flow voids are preserved. No areas of chronic infarction identified. No mass lesion, midline shift, or mass effect. No hydrocephalus. No extra-axial fluid collection. Major dural sinuses are grossly patent. Incidental note made of a large DVA within the right cerebellar hemisphere. Craniocervical junction normal. Visualized upper cervical spine within normal limits without significant degenerative changes. Pituitary gland normal. No acute abnormality about the globes and orbits. Mild scattered mucosal thickening within ethmoidal air cells. Paranasal sinuses are otherwise clear. Right mastoid effusion present. Left mastoid air cells clear. Inner ear structures grossly normal. Bone marrow signal intensity within normal limits. No scalp soft tissue abnormality. MRA HEAD FINDINGS ANTERIOR CIRCULATION: Visualized distal cervical segments of the internal carotid arteries are patent with antegrade flow. Petrous, cavernous, and supraclinoid segments widely patent. A1 segments, anterior communicating artery common anterior cerebral arteries well opacified. M1 segments patent without stenosis or occlusion. MCA bifurcations normal. Distal MCA branches well opacified and symmetric. POSTERIOR CIRCULATION: Vertebral arteries patent to the vertebrobasilar junction. Posterior inferior cerebral arteries patent. Basilar artery widely patent. Superior cerebellar arteries patent bilaterally. Both of the posterior cerebral arteries arise from the basilar artery and are well opacified to their distal aspects. No aneurysm or vascular malformation. IMPRESSION: MRI HEAD IMPRESSION: 1. No acute intracranial process. 2. Incidental right cerebellar DVA. 3. Right mastoid  effusion. MRA HEAD IMPRESSION: Normal intracranial MRA. Electronically Signed   By: Rise Mu M.D.   On: 06/05/2016 04:48   Mr Cervical Spine Wo Contrast  06/05/2016  CLINICAL DATA:  Acute onset left hand and arm weakness. EXAM: MRI CERVICAL SPINE WITHOUT CONTRAST TECHNIQUE: Multiplanar, multisequence MR imaging of the cervical spine was performed. No intravenous contrast was administered. COMPARISON:  None. FINDINGS: Alignment: Normal Vertebrae: No acute compression fracture, facet edema or focal marrow lesion. Cord: Normal caliber and signal Posterior Fossa, vertebral arteries, paraspinal tissues: Visualized posterior fossa is normal. Vertebral artery flow voids are preserved. Normal visualized paraspinal soft tissues. Disc levels: C1-C2: Normal. C2-C3: Normal disc space and facet joints. No spinal canal stenosis. No neuroforaminal stenosis. C3-C4: Left-greater-than-right facet hypertrophy. No significant disc bulge. No spinal canal stenosis. Mild neuroforaminal stenosis. C4-C5: Right foraminal disc osteophyte complex and left-greater-than-right facet hypertrophy. No spinal canal stenosis. Moderate right and mild left neuroforaminal stenosis. C5-C6: Bilateral mild uncovertebral spurring with small disc bulge. No spinal canal stenosis. Mild bilateral neuroforaminal stenosis. C6-C7: Mild bilateral uncovertebral hypertrophy. No spinal canal stenosis. No neuroforaminal stenosis. C7-T1: Mild disc space narrowing with small bulge. No spinal canal stenosis. No neuroforaminal stenosis. IMPRESSION: 1. 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 2. Mild bilateral C5-6 neural foraminal narrowing. Electronically Signed   By: Deatra Robinson M.D.   On: 06/05/2016 19:22   Mr Maxine Glenn Head/brain Wo Cm  06/05/2016  CLINICAL DATA:  Initial evaluation for left-sided numbness, weakness. EXAM: MRI HEAD  WITHOUT  CONTRAST MRA HEAD WITHOUT CONTRAST TECHNIQUE: Multiplanar, multiecho pulse sequences of the brain and surrounding structures were obtained without intravenous contrast. Angiographic images of the head were obtained using MRA technique without contrast. COMPARISON:  Prior CT from 06/04/2016. FINDINGS: MRI HEAD FINDINGS Cerebral volume within normal limits for patient age. No significant cerebral white matter disease. No abnormal foci of restricted diffusion to suggest acute ischemia. Gray-white matter differentiation maintained. Major intracranial vascular flow voids are preserved. No areas of chronic infarction identified. No mass lesion, midline shift, or mass effect. No hydrocephalus. No extra-axial fluid collection. Major dural sinuses are grossly patent. Incidental note made of a large DVA within the right cerebellar hemisphere. Craniocervical junction normal. Visualized upper cervical spine within normal limits without significant degenerative changes. Pituitary gland normal. No acute abnormality about the globes and orbits. Mild scattered mucosal thickening within ethmoidal air cells. Paranasal sinuses are otherwise clear. Right mastoid effusion present. Left mastoid air cells clear. Inner ear structures grossly normal. Bone marrow signal intensity within normal limits. No scalp soft tissue abnormality. MRA HEAD FINDINGS ANTERIOR CIRCULATION: Visualized distal cervical segments of the internal carotid arteries are patent with antegrade flow. Petrous, cavernous, and supraclinoid segments widely patent. A1 segments, anterior communicating artery common anterior cerebral arteries well opacified. M1 segments patent without stenosis or occlusion. MCA bifurcations normal. Distal MCA branches well opacified and symmetric. POSTERIOR CIRCULATION: Vertebral arteries patent to the vertebrobasilar junction. Posterior inferior cerebral arteries patent. Basilar artery widely patent. Superior cerebellar arteries patent  bilaterally. Both of the posterior cerebral arteries arise from the basilar artery and are well opacified to their distal aspects. No aneurysm or vascular malformation. IMPRESSION: MRI HEAD IMPRESSION: 1. No acute intracranial process. 2. Incidental right cerebellar DVA. 3. Right mastoid effusion. MRA HEAD IMPRESSION: Normal intracranial MRA. Electronically Signed   By: Rise Mu M.D.   On: 06/05/2016 04:48   Ct Head Code Stroke W/o Cm  06/04/2016  CLINICAL DATA:  Code stroke. Acute onset of left hand numbness earlier today. EXAM: CT HEAD WITHOUT CONTRAST TECHNIQUE: Contiguous axial images were obtained from the base of the skull through the vertex without intravenous contrast. COMPARISON:  None. FINDINGS: Ventricular system normal in size and appearance for age. Mild cortical and deep atrophy. Mild changes of small vessel disease of the white matter diffusely. No mass lesion. No midline shift. No acute hemorrhage or hematoma. No extra-axial fluid collections. No evidence of acute cortical stroke. No skull fractures identified. No intrinsic osseous abnormality. Visualized paranasal sinuses well aerated. Nasal bones intact. Normal-appearing sella turcica. CT visualized paranasal sinuses well aerated. Opacification of right mastoid air cells. Left mastoid air cells and bilateral middle ear cavities well-aerated. IMPRESSION: 1. No acute intracranial abnormality. 2. 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 3. Right mastoid effusion. These results were called by telephone at the time of interpretation on 06/04/2016 at 7:52 pm to Dr. Roseanne Reno of Neurology, who verbally acknowledged these results. Electronically Signed   By: Hulan Saas M.D.   On: 06/04/2016 19:53      Subjective: Anxious to go home. Denies complaints. States that his left upper extremity symptoms have resolved. No cramping in lower extremities or pain elsewhere. No chest pain or  palpitations.  Discharge Exam:  Filed Vitals:   06/06/16 0116 06/06/16 0552 06/06/16 0916 06/06/16 1315  BP: 92/54 103/49 89/55 118/69  Pulse: 65 66 66 67  Temp: 98.3 F (36.8 C) 98.6 F (37 C) 97.9 F (36.6  C) 98.1 F (36.7 C)  TempSrc: Oral Oral Oral Oral  Resp: Height:      Weight:      SpO2: 100% 100% 99% 100%    General exam: Pleasant elderly male lying comfortably supine in bed. Daughter is at bedside. Respiratory system: Clear to auscultation. Respiratory effort normal. Cardiovascular system: S1 & S2 heard, RRR. No JVD, murmurs, rubs, gallops or clicks. No pedal edema. Telemetry: Sinus rhythm mostly. PVCs. 15 beat nonsustained wide complex tachycardia noted on 7/14 at 12:58 PM. No further events and remained in sinus rhythm. Gastrointestinal system: Abdomen is nondistended, soft and nontender. No organomegaly or masses felt. Normal bowel sounds heard. Central nervous system: Alert and oriented. No focal neurological deficits. Extremities: Symmetric 5 x 5 power. Skin: No rashes, lesions or ulcers Psychiatry: Judgement and insight appear normal. Mood & affect appropriate.     The results of significant diagnostics from this hospitalization (including imaging, microbiology, ancillary and laboratory) are listed below for reference.     Microbiology: No results found for this or any previous visit (from the past 240 hour(s)).   Labs: BNP (last 3 results) No results for input(s): BNP in the last 8760 hours. Basic Metabolic Panel:  Recent Labs Lab 06/04/16 1941 06/04/16 1953 06/05/16 0010 06/05/16 1815 06/06/16 0500  NA 125* 127* 130* 137 143  K 3.7 3.7 4.4 3.6 3.5  CL 89* 94* 96* 109 112*  CO2 24  --  GLUCOSE 95 91 79 98 84  BUN 24* 25* 21* 9 7  CREATININE 1.53* 1.60* 1.25* 0.77 0.76  CALCIUM 9.8  --  9.2 8.3* 8.0*  MG  --   --   --  2.0  --    Liver Function Tests:  Recent Labs Lab 06/04/16 1941 06/05/16 0755  AST 44* 42*  ALT  27 23  ALKPHOS 75 75  BILITOT 1.7* 1.1  PROT 7.7 6.6  ALBUMIN 4.9 4.2   No results for input(s): LIPASE, AMYLASE in the last 168 hours. No results for input(s): AMMONIA in the last 168 hours. CBC:  Recent Labs Lab 06/04/16 1941 06/04/16 1953 06/05/16 0010  WBC 9.8  --  7.2  NEUTROABS 7.9*  --   --   HGB 15.1 15.6 13.8  HCT 43.2 46.0 39.9  MCV 89.8  --  90.1  PLT 200  --  166   Cardiac Enzymes:  Recent Labs Lab 06/04/16 1941 06/05/16 0755 06/05/16 1815 06/06/16 0500  CKTOTAL 1108* 1136* 766* 528*   BNP: Invalid input(s): POCBNP CBG:  Recent Labs Lab 06/04/16 2008  GLUCAP 91   D-Dimer No results for input(s): DDIMER in the last 72 hours. Hgb A1c No results for input(s): HGBA1C in the last 72 hours. Lipid Profile  Recent Labs  06/05/16 0010  CHOL 163  HDL 67  LDLCALC 82  TRIG 70  CHOLHDL 2.4   Thyroid function studies No results for input(s): TSH, T4TOTAL, T3FREE, THYROIDAB in the last 72 hours.  Invalid input(s): FREET3 Anemia work up No results for input(s): VITAMINB12, FOLATE, FERRITIN, TIBC, IRON, RETICCTPCT in the last 72 hours. Urinalysis No results found for: COLORURINE, APPEARANCEUR, LABSPEC, PHURINE, GLUCOSEU, HGBUR, BILIRUBINUR, KETONESUR, PROTEINUR, UROBILINOGEN, NITRITE, LEUKOCYTESUR Sepsis Labs Invalid input(s): PROCALCITONIN,  WBC,  LACTICIDVEN  Discussed in detail with patient's daughter at bedside. Updated care and answered questions. She works as a Human resources officer at [employer].  Time coordinating discharge: Over 30 minutes  SIGNED:  Marilea Gwynne,  MD, FACP, FHM. [facility] Pager [phone]  If 7PM-7AM, please contact night-coverage [email or web address] Password [email or web address] 06/06/2016, 1:29 PM

## 2016-06-06 NOTE — Evaluation (Signed)
History: Ethan Padilla is a 75 year-old patient with a history of events suspicious for seizure activity.  Technique: This is a 21 channel routine scalp EEG performed at the bedside with bipolar and monopolar montages arranged in accordance to the international 10/20 system of electrode placement. One channel was dedicated to EKG recording.    Background: The background consists of intermixed alpha and beta activities. There is a well defined posterior dominant rhythm of 9 Hz that attenuates with eye opening. Sleep is recorded with normal appearing structures.    EEG Abnormalities: Intermittent slowing was noted related to sleep.  Clinical Interpretation: This normal EEG is recorded in the waking and sleeping states. There was no seizure or seizure predisposition recorded on this study. Please note that a normal EEG does not preclude the possibility of epilepsy. There were intermittent PVCs noted.  This was discussed with the patient's attending and has been addressed.  Dr. Rudy JewJames A. Hilda BladesArmstrong, MD Neurohospitalist

## 2021-11-15 ENCOUNTER — Other Ambulatory Visit: Payer: Self-pay

## 2021-11-15 ENCOUNTER — Encounter (HOSPITAL_BASED_OUTPATIENT_CLINIC_OR_DEPARTMENT_OTHER): Payer: Self-pay | Admitting: Emergency Medicine

## 2021-11-15 ENCOUNTER — Emergency Department (HOSPITAL_BASED_OUTPATIENT_CLINIC_OR_DEPARTMENT_OTHER): Payer: Federal, State, Local not specified - PPO

## 2021-11-15 ENCOUNTER — Emergency Department (HOSPITAL_BASED_OUTPATIENT_CLINIC_OR_DEPARTMENT_OTHER)
Admission: EM | Admit: 2021-11-15 | Discharge: 2021-11-15 | Disposition: A | Discharge status: Home / Self Care | Payer: Federal, State, Local not specified - PPO | Attending: Emergency Medicine | Admitting: Emergency Medicine

## 2021-11-15 DIAGNOSIS — Z20822 Contact with and (suspected) exposure to covid-19: Secondary | ICD-10-CM | POA: Diagnosis not present

## 2021-11-15 DIAGNOSIS — J069 Acute upper respiratory infection, unspecified: Secondary | ICD-10-CM

## 2021-11-15 DIAGNOSIS — Z7982 Long term (current) use of aspirin: Secondary | ICD-10-CM | POA: Insufficient documentation

## 2021-11-15 DIAGNOSIS — R0602 Shortness of breath: Secondary | ICD-10-CM | POA: Diagnosis present

## 2021-11-15 LAB — CBC WITH DIFFERENTIAL/PLATELET
Abs Immature Granulocytes: 0.04 10*3/uL (ref 0.00–0.07)
Basophils Absolute: 0 10*3/uL (ref 0.0–0.1)
Basophils Relative: 1 %
Eosinophils Absolute: 0.1 10*3/uL (ref 0.0–0.5)
Eosinophils Relative: 2 %
HCT: 40.5 % (ref 39.0–52.0)
Hemoglobin: 13.6 g/dL (ref 13.0–17.0)
Immature Granulocytes: 1 %
Lymphocytes Relative: 22 %
Lymphs Abs: 1.6 10*3/uL (ref 0.7–4.0)
MCH: 31.4 pg (ref 26.0–34.0)
MCHC: 33.6 g/dL (ref 30.0–36.0)
MCV: 93.5 fL (ref 80.0–100.0)
Monocytes Absolute: 0.6 10*3/uL (ref 0.1–1.0)
Monocytes Relative: 8 %
Neutro Abs: 4.8 10*3/uL (ref 1.7–7.7)
Neutrophils Relative %: 66 %
Platelets: 192 10*3/uL (ref 150–400)
RBC: 4.33 MIL/uL (ref 4.22–5.81)
RDW: 12.5 % (ref 11.5–15.5)
WBC: 7.1 10*3/uL (ref 4.0–10.5)
nRBC: 0 % (ref 0.0–0.2)

## 2021-11-15 LAB — RESP PANEL BY RT-PCR (FLU A&B, COVID) ARPGX2
Influenza A by PCR: NEGATIVE
Influenza B by PCR: NEGATIVE
SARS Coronavirus 2 by RT PCR: NEGATIVE

## 2021-11-15 LAB — GROUP A STREP BY PCR: Group A Strep by PCR: NOT DETECTED

## 2021-11-15 LAB — BASIC METABOLIC PANEL
Anion gap: 7 (ref 5–15)
BUN: 12 mg/dL (ref 8–23)
CO2: 28 mmol/L (ref 22–32)
Calcium: 8.8 mg/dL — ABNORMAL LOW (ref 8.9–10.3)
Chloride: 100 mmol/L (ref 98–111)
Creatinine, Ser: 0.76 mg/dL (ref 0.61–1.24)
GFR, Estimated: >60 mL/min (ref >60)
Glucose, Bld: 93 mg/dL (ref 70–99)
Potassium: 3.6 mmol/L (ref 3.5–5.1)
Sodium: 135 mmol/L (ref 135–145)

## 2021-11-15 LAB — TROPONIN I (HIGH SENSITIVITY)
Troponin I (High Sensitivity): 5 ng/L (ref <18)
Troponin I (High Sensitivity): 4 ng/L (ref <18)

## 2021-11-15 LAB — D-DIMER, QUANTITATIVE: D-Dimer, Quant: <0.27 ug/mL-FEU (ref 0.00–0.50)

## 2021-11-15 MED ORDER — IPRATROPIUM-ALBUTEROL 0.5-2.5 (3) MG/3ML IN SOLN: 3.0000 mL | Freq: Once | RESPIRATORY_TRACT | Status: DC

## 2021-11-15 MED ORDER — DOXYCYCLINE HYCLATE 100 MG PO CAPS: 100.0000 mg | ORAL_CAPSULE | Freq: Two times a day (BID) | ORAL | 0 refills | Status: AC

## 2021-11-15 MED ORDER — ALBUTEROL SULFATE HFA 108 (90 BASE) MCG/ACT IN AERS: 1.0000 | INHALATION_SPRAY | Freq: Four times a day (QID) | RESPIRATORY_TRACT | 0 refills | Status: AC | PRN

## 2021-11-15 MED ORDER — METHYLPREDNISOLONE SODIUM SUCC 125 MG IJ SOLR
125.0000 mg | Freq: Once | INTRAMUSCULAR | Status: AC
  Administered 2021-11-15: 01:00:00 125 mg via INTRAVENOUS
  Filled 2021-11-15: qty 2

## 2021-11-15 MED ORDER — ALBUTEROL SULFATE HFA 108 (90 BASE) MCG/ACT IN AERS
4.0000 | INHALATION_SPRAY | Freq: Once | RESPIRATORY_TRACT | Status: AC
  Administered 2021-11-15: 01:00:00 4 via RESPIRATORY_TRACT
  Filled 2021-11-15: qty 6.7

## 2021-11-15 MED ORDER — BENZONATATE 100 MG PO CAPS: 100.0000 mg | ORAL_CAPSULE | Freq: Three times a day (TID) | ORAL | 0 refills | Status: AC | PRN

## 2021-11-15 MED ORDER — PREDNISONE 50 MG PO TABS: 60.0000 mg | ORAL_TABLET | Freq: Once | ORAL | Status: DC

## 2021-11-15 MED ORDER — IPRATROPIUM-ALBUTEROL 0.5-2.5 (3) MG/3ML IN SOLN
3.0000 mL | Freq: Once | RESPIRATORY_TRACT | Status: AC
  Administered 2021-11-15: 02:00:00 3 mL via RESPIRATORY_TRACT
  Filled 2021-11-15: qty 3

## 2021-11-15 MED ORDER — BENZONATATE 100 MG PO CAPS
200.0000 mg | ORAL_CAPSULE | Freq: Once | ORAL | Status: AC
  Administered 2021-11-15: 02:00:00 200 mg via ORAL
  Filled 2021-11-15: qty 2

## 2021-11-15 MED ORDER — PREDNISONE 50 MG PO TABS: ORAL_TABLET | ORAL | 0 refills | Status: AC

## 2021-11-15 NOTE — ED Provider Notes (Signed)
[facility] EMERGENCY DEPARTMENT Provider Note   CSN: [id number] Arrival date & time: 11/15/21  0004     History Chief Complaint  Patient presents with   Shortness of Breath   Cough    Ethan Padilla is a 80 y.o. male.  Patient visiting from [place].  States he was diagnosed with the flu on December 10.  He was given a course of steroids, albuterol, Symbicort and Flonase.  States he has been managing rather well.  While visiting family tonight he had acute episode of shortness of breath with difficulty breathing, coughing, tightness in his chest.  Felt like he could not catch his breath.  Had a severe coughing fit after going outside. Was also around some cats.  Feels tight in his chest when he coughs.  His cough is nonproductive.  States he has had no further fevers. He attempted to use albuterol and Symbicort prior to coming to the ED without relief.  Still feels like he cannot catch his breath and is having coughing spells.  He denies abdominal pain, nausea or vomiting.  Patient states he does not have asthma or COPD and is not on inhalers on a regular basis.  States he has ever been hospitalized for his breathing.  He does not smoke. He states he uses inhalers only when he is ill. Chart review documents history of asthma and regular Symbicort use.  He denies any cardiac history.  He denies any history of diabetes, hypertension or high cholesterol. He traveled by plane from [place].  The history is provided by the patient.  Shortness of Breath Associated symptoms: chest pain and cough   Associated symptoms: no abdominal pain, no fever, no headaches, no rash and no vomiting   Cough Associated symptoms: chest pain, rhinorrhea and shortness of breath   Associated symptoms: no fever, no headaches, no myalgias and no rash       Past Medical History:  Diagnosis Date   Male pattern baldness    No family history of cardiac disease     Patient Active Problem List    Diagnosis Date Noted   Left upper extremity numbness 06/06/2016   NSVT (nonsustained ventricular tachycardia) 06/05/2016   TIA (transient ischemic attack) 06/04/2016   Hyponatremia 06/04/2016   AKI (acute kidney injury) ([place]) 06/04/2016   PVC (premature ventricular contraction) 06/04/2016    Past Surgical History:  Procedure Laterality Date   APPENDECTOMY         Family History  Problem Relation Age of Onset   Heart attack Neg Hx        No family history of CAD. Father lived into his 15's.    Social History   Tobacco Use   Smoking status: Never  Substance Use Topics   Alcohol use: Yes    Comment: occasional   Drug use: No    Home Medications Prior to Admission medications   Medication Sig Start Date End Date Taking? Authorizing Provider  aspirin EC 81 MG EC tablet Take 1 tablet (81 mg total) by mouth daily. 06/06/16   Hongalgi, Lenis Dickinson, MD  finasteride (PROPECIA) 1 MG tablet Take 1 mg by mouth daily. 03/20/16   [provider]    Allergies    Patient has no known allergies.  Review of Systems   Review of Systems  Constitutional:  Negative for fever.  HENT:  Positive for congestion and rhinorrhea.   Respiratory:  Positive for cough, chest tightness and shortness of breath.   Cardiovascular:  Positive for chest pain.  Gastrointestinal:  Negative for abdominal pain, nausea and vomiting.  Genitourinary:  Negative for dysuria and hematuria.  Musculoskeletal:  Negative for arthralgias and myalgias.  Skin:  Negative for rash.  Neurological:  Negative for dizziness, weakness and headaches.   all other systems are negative except as noted in the HPI and PMH.   Physical Exam Updated Vital Signs BP (!) 145/94    Pulse 77    Temp 97.7 F (36.5 C) (Oral)    Resp 20    Ht 5\' 7"  (1.702 m)    Wt 66.5 kg    SpO2 100%    BMI 22.96 kg/m   Physical Exam Vitals and nursing note reviewed.  Constitutional:      General: He is not in acute distress.    Appearance: He  is well-developed.     Comments: Speaking in full sentences, frequent coughing fit  HENT:     Head: Normocephalic and atraumatic.     Mouth/Throat:     Pharynx: Posterior oropharyngeal erythema present. No oropharyngeal exudate.     Comments: Several small ulcerations to uvula and soft palate No tonsillar asymmetry or edema Eyes:     Conjunctiva/sclera: Conjunctivae normal.     Pupils: Pupils are equal, round, and reactive to light.  Neck:     Comments: No meningismus. Cardiovascular:     Rate and Rhythm: Normal rate and regular rhythm.     Heart sounds: Normal heart sounds. No murmur heard. Pulmonary:     Effort: Pulmonary effort is normal. No respiratory distress.     Breath sounds: Wheezing present.     Comments: Lungs mostly clear.  Some high-pitched upper respiratory noises with expiration. Chest:     Chest wall: No tenderness.  Abdominal:     Palpations: Abdomen is soft.     Tenderness: There is no abdominal tenderness. There is no guarding or rebound.  Musculoskeletal:        General: No tenderness. Normal range of motion.     Cervical back: Normal range of motion and neck supple.  Skin:    General: Skin is warm.  Neurological:     Mental Status: He is alert and oriented to person, place, and time.     Cranial Nerves: No cranial nerve deficit.     Motor: No abnormal muscle tone.     Coordination: Coordination normal.     Comments:  5/5 strength throughout. CN 2-12 intact.Equal grip strength.   Psychiatric:        Behavior: Behavior normal.    ED Results / Procedures / Treatments   Labs (all labs ordered are listed, but only abnormal results are displayed) Labs Reviewed  BASIC METABOLIC PANEL - Abnormal; Notable for the following components:      Result Value   Calcium 8.8 (*)    All other components within normal limits  RESP PANEL BY RT-PCR (FLU A&B, COVID) ARPGX2  GROUP A STREP BY PCR  CBC WITH DIFFERENTIAL/PLATELET  D-DIMER, QUANTITATIVE  TROPONIN I (HIGH  SENSITIVITY)  TROPONIN I (HIGH SENSITIVITY)    EKG EKG Interpretation  Date/Time:  Saturday November 15 2021 00:41:59 EST Ventricular Rate:  85 PR Interval:  139 QRS Duration: 103 QT Interval:  407 QTC Calculation: 484 R Axis:   -47 Text Interpretation: Sinus rhythm Left anterior fascicular block Low voltage, precordial leads Abnormal R-wave progression, late transition Borderline prolonged QT interval No significant change was found Confirmed by Ezequiel Essex [phone]) on 11/15/2021 12:53:37  AM  Radiology DG Neck Soft Tissue  Result Date: 11/15/2021 CLINICAL DATA:  Cough. EXAM: NECK SOFT TISSUES - 1+ VIEW COMPARISON:  None. FINDINGS: There is no evidence of retropharyngeal soft tissue swelling or epiglottic enlargement. The cervical airway is unremarkable and no radio-opaque foreign body identified. IMPRESSION: Negative. Electronically Signed   By: Elgie Collard M.D.   On: 11/15/2021 01:22   DG Chest 2 View  Result Date: 11/15/2021 CLINICAL DATA:  Chest pain EXAM: CHEST - 2 VIEW COMPARISON:  None. FINDINGS: The heart size and mediastinal contours are within normal limits. Both lungs are clear. The visualized skeletal structures are unremarkable. IMPRESSION: No active cardiopulmonary disease. Electronically Signed   By: Deatra Robinson M.D.   On: 11/15/2021 01:23    Procedures Procedures   Medications Ordered in ED Medications  ipratropium-albuterol (DUONEB) 0.5-2.5 (3) MG/3ML nebulizer solution 3 mL (has no administration in time range)  albuterol (VENTOLIN HFA) 108 (90 Base) MCG/ACT inhaler 4 puff (has no administration in time range)  methylPREDNISolone sodium succinate (SOLU-MEDROL) 125 mg/2 mL injection 125 mg (has no administration in time range)    ED Course  I have reviewed the triage vital signs and the nursing notes.  Pertinent labs & imaging results that were available during my care of the patient were reviewed by me and considered in my medical decision making  (see chart for details).    MDM Rules/Calculators/A&P                         Patient with recent diagnosis of flu here with coughing fit, shortness of breath and chest tightness.  No hypoxia or increased work of breathing.  Lungs mostly clear but does have some upper airway expiratory high-pitched noises. States he has not been diagnosed with asthma  Patient given bronchodilators as well as steroids.  COVID and flu testing are negative today.  Chest x-ray negative infiltrate.  Labs reassuring.  Troponin negative, D-dimer negative.  Low suspicion for ACS or pulmonary embolism.  Paroxysmal of coughing have since improved.  Patient moving air well and wheezing has subsided.  Ambulatory without desaturation.  Troponin negative x2.  Doubt ACS.  Patient will be given course of antibiotics given length of illness and persistent cough.  He is requesting a refill of his albuterol inhaler.  He is hesitant to try additional steroids but prescription will be given in case his breathing and coughing worsens.  Advised he can start prednisone 2 days if his breathing is not improved.  Follow-up with his doctor.  Return to the ED with difficulty breathing, exertional chest pain, not able to tolerate p.o. or any other concerns.     Final Clinical Impression(s) / ED Diagnoses Final diagnoses:  URI with cough and congestion    Rx / DC Orders ED Discharge Orders     None        Dian Minahan, Jeannett Senior, MD 11/15/21 [phone]

## 2021-11-15 NOTE — ED Triage Notes (Signed)
Pt states had flu shot few weeks ago but tested positive for flu a week ago was seen and prescribed meds. Now feels like cant catch his breath and need a breathing treatment.

## 2021-11-15 NOTE — Discharge Instructions (Addendum)
No evidence of pneumonia.  Your COVID and flu tests are negative today.  Take the antibiotics as prescribed for bronchitis as well as use your albuterol as needed every 4 hours. If no improvement in 2 days you may start the prednisone for 5 days follow-up with your doctor.  Return to the ED with worsening shortness of breath, chest pain with exertion, persistent fever, persistent cough or any other concerns.

## 2021-11-16 ENCOUNTER — Encounter (HOSPITAL_BASED_OUTPATIENT_CLINIC_OR_DEPARTMENT_OTHER): Payer: Self-pay | Admitting: *Deleted

## 2021-11-16 ENCOUNTER — Other Ambulatory Visit: Payer: Self-pay

## 2021-11-16 ENCOUNTER — Emergency Department (HOSPITAL_BASED_OUTPATIENT_CLINIC_OR_DEPARTMENT_OTHER)
Admission: EM | Admit: 2021-11-16 | Discharge: 2021-11-16 | Disposition: A | Discharge status: Home / Self Care | Payer: Federal, State, Local not specified - PPO | Attending: Emergency Medicine | Admitting: Emergency Medicine

## 2021-11-16 DIAGNOSIS — Z20822 Contact with and (suspected) exposure to covid-19: Secondary | ICD-10-CM | POA: Diagnosis not present

## 2021-11-16 DIAGNOSIS — Z7982 Long term (current) use of aspirin: Secondary | ICD-10-CM | POA: Diagnosis not present

## 2021-11-16 DIAGNOSIS — Z1152 Encounter for screening for COVID-19: Secondary | ICD-10-CM | POA: Diagnosis present

## 2021-11-16 LAB — RESP PANEL BY RT-PCR (FLU A&B, COVID) ARPGX2
Influenza A by PCR: NEGATIVE
Influenza B by PCR: NEGATIVE
SARS Coronavirus 2 by RT PCR: NEGATIVE

## 2021-11-16 MED ORDER — NIRMATRELVIR/RITONAVIR (PAXLOVID)TABLET: 3.0000 | ORAL_TABLET | Freq: Two times a day (BID) | ORAL | 0 refills | Status: DC

## 2021-11-16 MED ORDER — NIRMATRELVIR/RITONAVIR (PAXLOVID)TABLET: 3.0000 | ORAL_TABLET | Freq: Two times a day (BID) | ORAL | 0 refills | Status: AC

## 2021-11-16 NOTE — Discharge Instructions (Addendum)
Your COVID test today was negative however your wife did test positive.  Because of this exposure I have sent 10 pack Slo-Bid for you to the pharmacy.  If your symptoms worsen please return.  Also on review of your visit yesterday you were instructed to start your antibiotic.  If your symptoms were not to improve in the next 2 days you were instructed to start the prednisone.  If you are planning to drive back to [place] please take frequent breaks because COVID can put you at an increased risk of blood clots.

## 2021-11-16 NOTE — ED Notes (Signed)
ED Provider at bedside. 

## 2021-11-16 NOTE — ED Triage Notes (Signed)
Exposure to covid , wife is +, sx x 1 week

## 2021-11-16 NOTE — ED Provider Notes (Signed)
[facility] EMERGENCY DEPARTMENT Provider Note   CSN: [id number] Arrival date & time: 11/16/21  1847     History Chief Complaint  Patient presents with   exposure to covid     Ethan Padilla is a 80 y.o. male.  80 year old male presents today for evaluation following finding out that he was exposed to COVID because his wife tested positive with a home test.  Patient was seen in this emergency room yesterday and was diagnosed with viral URI with cough.  Patient reports he was diagnosed with flu on 12/10 and had ongoing symptoms for which she was seen last night for.  He found out today that his wife was positive for COVID and wanted to come in to see if it was a true positive given he tested negative yesterday and if there was anything to protect him against severe disease given he has to travel back to [place] soon.  He denies any fever, chills, shortness of breath, chest pain, abdominal pain, nausea, or vomiting.  He reports his symptoms have not changed since yesterday.  He was prescribed antibiotic and prednisone but states he has not started those and if he does not improve in the next couple days he was instructed to start taking those.       Past Medical History:  Diagnosis Date   Male pattern baldness    No family history of cardiac disease     Patient Active Problem List   Diagnosis Date Noted   Left upper extremity numbness 06/06/2016   NSVT (nonsustained ventricular tachycardia) 06/05/2016   TIA (transient ischemic attack) 06/04/2016   Hyponatremia 06/04/2016   AKI (acute kidney injury) ([facility]) 06/04/2016   PVC (premature ventricular contraction) 06/04/2016    Past Surgical History:  Procedure Laterality Date   APPENDECTOMY         Family History  Problem Relation Age of Onset   Heart attack Neg Hx        No family history of CAD. Father lived into his 36's.    Social History   Tobacco Use   Smoking status: Never  Substance Use Topics   Alcohol  use: Yes    Comment: occasional   Drug use: No    Home Medications Prior to Admission medications   Medication Sig Start Date End Date Taking? Authorizing Provider  albuterol (VENTOLIN HFA) 108 (90 Base) MCG/ACT inhaler Inhale 1-2 puffs into the lungs every 6 (six) hours as needed for wheezing or shortness of breath. 11/15/21   Glynn Octave, MD  aspirin EC 81 MG EC tablet Take 1 tablet (81 mg total) by mouth daily. 06/06/16   Hongalgi, Maximino Greenland, MD  benzonatate (TESSALON) 100 MG capsule Take 1 capsule (100 mg total) by mouth 3 (three) times daily as needed for cough. 11/15/21   Rancour, Jeannett Senior, MD  doxycycline (VIBRAMYCIN) 100 MG capsule Take 1 capsule (100 mg total) by mouth 2 (two) times daily. 11/15/21   Rancour, Jeannett Senior, MD  finasteride (PROPECIA) 1 MG tablet Take 1 mg by mouth daily. 03/20/16   [provider]  predniSONE (DELTASONE) 50 MG tablet 1 tablet PO daily 11/15/21   Glynn Octave, MD    Allergies    Patient has no known allergies.  Review of Systems   Review of Systems  Constitutional:  Negative for chills and fever.  HENT:  Negative for congestion and sore throat.   Respiratory:  Positive for cough. Negative for shortness of breath.   Cardiovascular:  Negative for  chest pain.  Gastrointestinal:  Negative for abdominal pain, nausea and vomiting.  Musculoskeletal:  Negative for myalgias.  Neurological:  Negative for weakness and light-headedness.  All other systems reviewed and are negative.  Physical Exam Updated Vital Signs BP 116/88 (BP Location: Right Arm)    Pulse 88    Temp 97.6 F (36.4 C) (Oral)    Resp 16    Ht 5\' 7"  (1.702 m)    Wt 65.8 kg    SpO2 99%    BMI 22.71 kg/m   Physical Exam Vitals and nursing note reviewed.  Constitutional:      General: He is not in acute distress.    Appearance: Normal appearance. He is not ill-appearing.  HENT:     Head: Normocephalic and atraumatic.     Nose: Nose normal.  Eyes:     General: No scleral  icterus.    Extraocular Movements: Extraocular movements intact.     Conjunctiva/sclera: Conjunctivae normal.  Cardiovascular:     Rate and Rhythm: Normal rate and regular rhythm.     Pulses: Normal pulses.  Pulmonary:     Effort: Pulmonary effort is normal. No respiratory distress.     Breath sounds: Normal breath sounds. No wheezing or rales.  Abdominal:     General: There is no distension.     Tenderness: There is no abdominal tenderness.  Musculoskeletal:        General: Normal range of motion.     Cervical back: Normal range of motion.  Skin:    General: Skin is warm and dry.  Neurological:     General: No focal deficit present.     Mental Status: He is alert. Mental status is at baseline.    ED Results / Procedures / Treatments   Labs (all labs ordered are listed, but only abnormal results are displayed) Labs Reviewed  RESP PANEL BY RT-PCR (FLU A&B, COVID) ARPGX2    EKG None  Radiology DG Neck Soft Tissue  Result Date: 11/15/2021 CLINICAL DATA:  Cough. EXAM: NECK SOFT TISSUES - 1+ VIEW COMPARISON:  None. FINDINGS: There is no evidence of retropharyngeal soft tissue swelling or epiglottic enlargement. The cervical airway is unremarkable and no radio-opaque foreign body identified. IMPRESSION: Negative. Electronically Signed   By: 11/17/2021 M.D.   On: 11/15/2021 01:22   DG Chest 2 View  Result Date: 11/15/2021 CLINICAL DATA:  Chest pain EXAM: CHEST - 2 VIEW COMPARISON:  None. FINDINGS: The heart size and mediastinal contours are within normal limits. Both lungs are clear. The visualized skeletal structures are unremarkable. IMPRESSION: No active cardiopulmonary disease. Electronically Signed   By: 11/17/2021 M.D.   On: 11/15/2021 01:23    Procedures Procedures   Medications Ordered in ED Medications - No data to display  ED Course  I have reviewed the triage vital signs and the nursing notes.  Pertinent labs & imaging results that were available  during my care of the patient were reviewed by me and considered in my medical decision making (see chart for details).    MDM Rules/Calculators/A&P                         80 year old male presents today for evaluation following being exposed to COVID.  Reports his wife tested positive with a home test today.  He wants to confirm whether or not he is COVID-positive and would like Paxlovid.  Patient was recently diagnosed with flu on 12/10 in  [place] and was symptomatically managed and was doing well until last night when he had an acute episode of shortness of breath which prompted him to present to the emergency room.  Patient was found to have wheezing and was given bronchodilators as well as steroids.  His respiratory panel was negative yesterday, and chest x-ray without pneumonia.  However given patient's duration of symptoms patient was prescribed antibiotics and was given a second course of antibiotics which he could start if his symptoms do not improve in the next couple days.  He has not started Ortho yet but also reports his symptoms have not worsened.  He denies significant episodes of shortness of breath like he did last night prior to arrival.  Does continue to have ongoing coughing spells.  Patient's respiratory panel was negative for COVID or flu today.  However his wife did test positive today for COVID.  Given his exposure will prescribe Paxlovid.  Return precautions discussed.  Patient voices understanding and is in agreement with plan.     Final Clinical Impression(s) / ED Diagnoses Final diagnoses:  None    Rx / DC Orders ED Discharge Orders     None        Marita [place], PA-C 11/16/21 2047    Alvira Monday, MD 11/19/21 1659

## 2023-07-26 IMAGING — CR DG NECK SOFT TISSUE
2 series · 2 of 2 positions shown · non-contrast
Comparison: None.

CLINICAL DATA: Cough.

EXAM:
NECK SOFT TISSUES - 1+ VIEW

[w soft tissue neck]
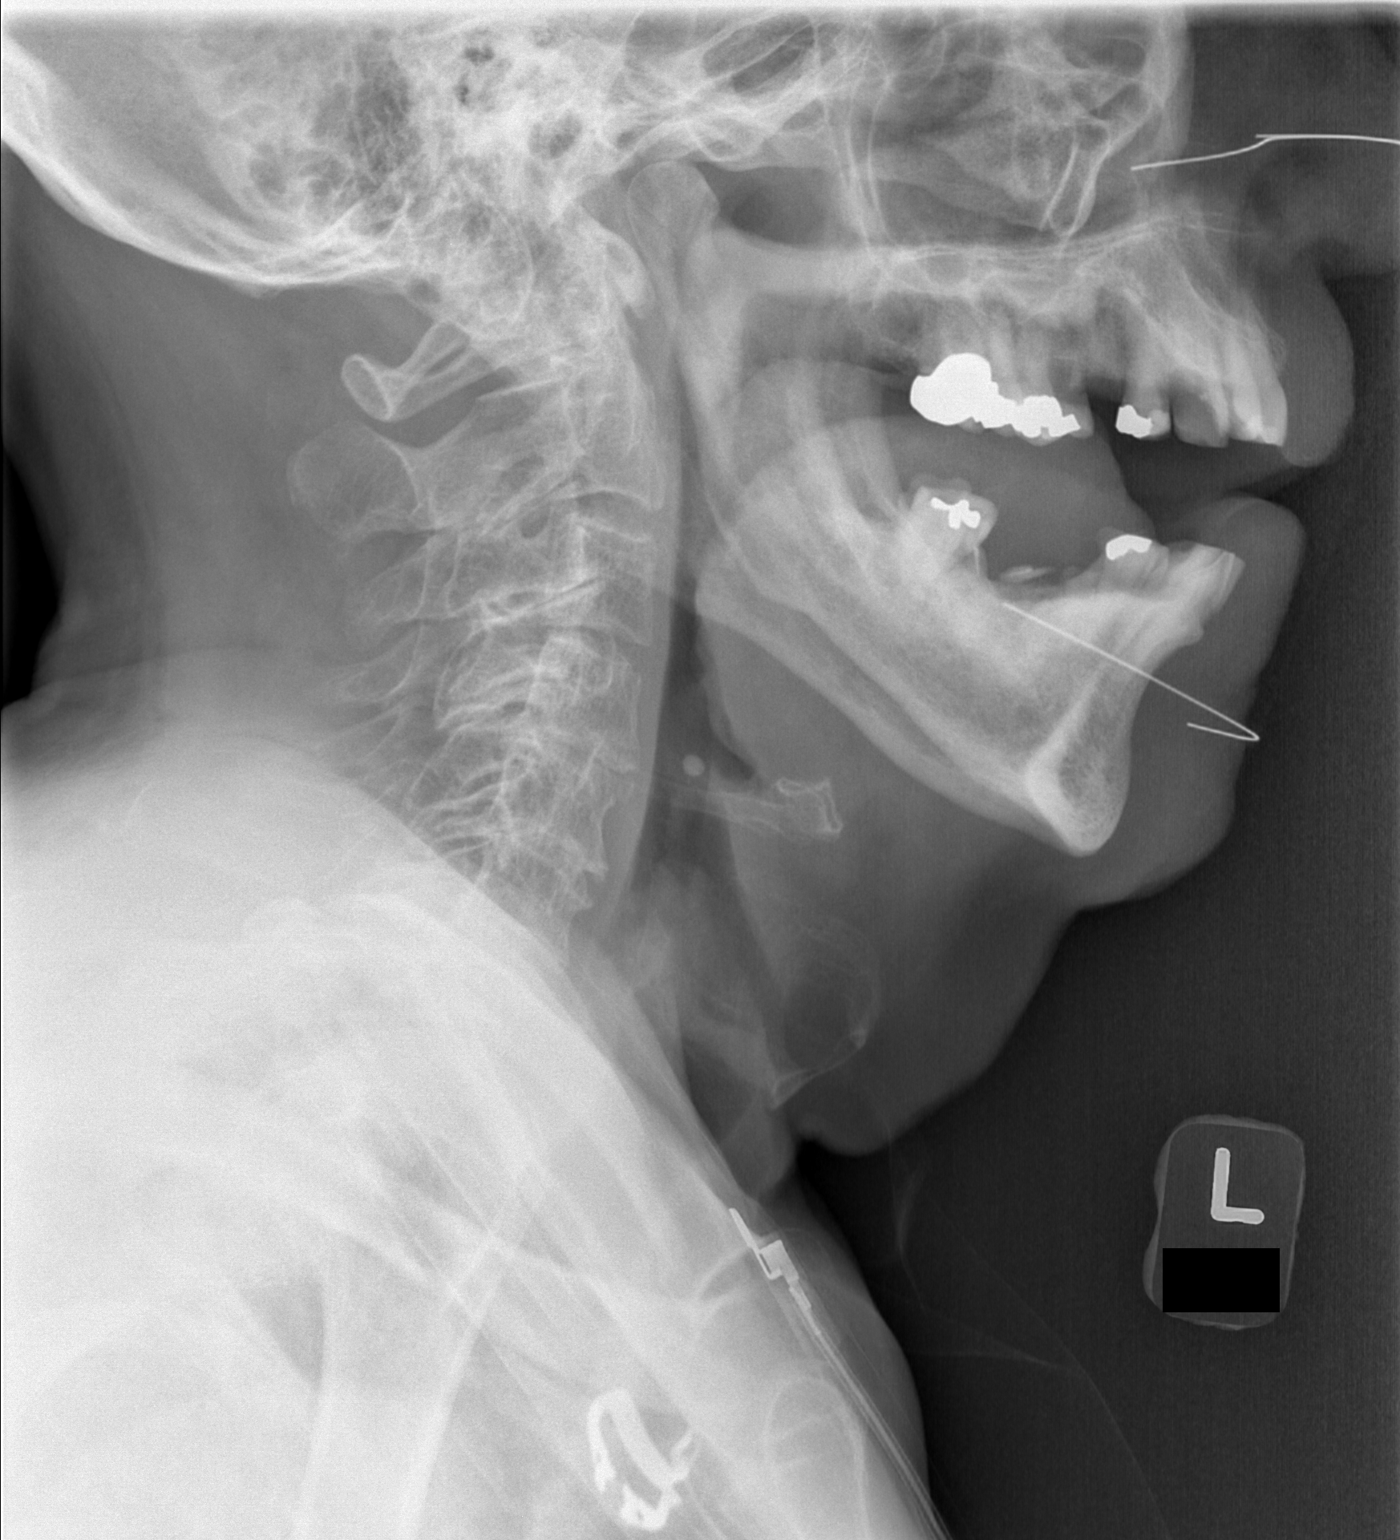

[w soft tissue neck ap]
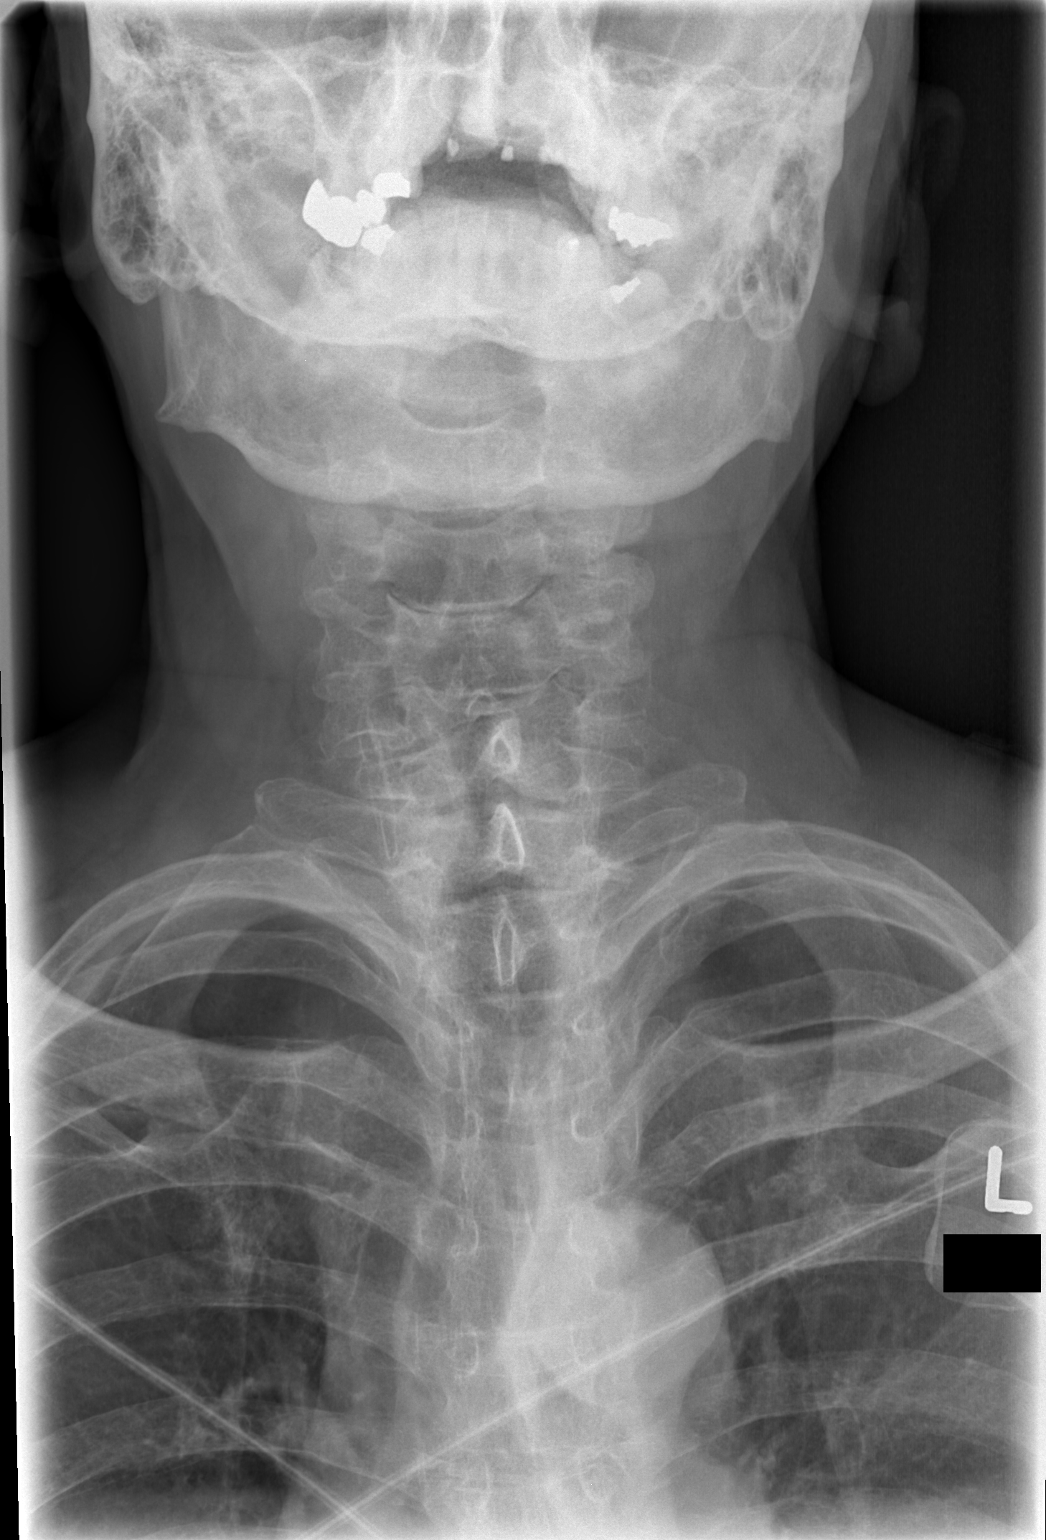

[2 of 2 positions shown; findings below may reference images not displayed]

FINDINGS: There is no evidence of retropharyngeal soft tissue swelling or
epiglottic enlargement. The cervical airway is unremarkable and no
radio-opaque foreign body identified.
IMPRESSION: Negative.
